# Patient Record
Sex: Female | Born: 1975 | Race: White | Hispanic: No | Marital: Married | Smoking: Never smoker
Health system: Southern US, Community
[De-identification: ages and names within clinical notes are randomized; demographics above are authoritative.]

## PROBLEM LIST (undated history)

## (undated) DIAGNOSIS — F909 Attention-deficit hyperactivity disorder, unspecified type: Secondary | ICD-10-CM

## (undated) DIAGNOSIS — N200 Calculus of kidney: Secondary | ICD-10-CM

## (undated) DIAGNOSIS — I10 Essential (primary) hypertension: Secondary | ICD-10-CM

## (undated) DIAGNOSIS — K529 Noninfective gastroenteritis and colitis, unspecified: Secondary | ICD-10-CM

## (undated) DIAGNOSIS — G43909 Migraine, unspecified, not intractable, without status migrainosus: Secondary | ICD-10-CM

## (undated) DIAGNOSIS — G459 Transient cerebral ischemic attack, unspecified: Secondary | ICD-10-CM

## (undated) DIAGNOSIS — F419 Anxiety disorder, unspecified: Secondary | ICD-10-CM

## (undated) DIAGNOSIS — E039 Hypothyroidism, unspecified: Secondary | ICD-10-CM

## (undated) HISTORY — DX: Noninfective gastroenteritis and colitis, unspecified: K52.9

## (undated) HISTORY — DX: Migraine, unspecified, not intractable, without status migrainosus: G43.909

## (undated) HISTORY — PX: DILATION AND CURETTAGE OF UTERUS: SHX78

## (undated) HISTORY — DX: Calculus of kidney: N20.0

## (undated) HISTORY — PX: OTHER SURGICAL HISTORY: SHX169

## (undated) HISTORY — PX: THYROIDECTOMY: SHX17

## (undated) HISTORY — DX: Anxiety disorder, unspecified: F41.9

## (undated) HISTORY — DX: Attention-deficit hyperactivity disorder, unspecified type: F90.9

## (undated) HISTORY — DX: Essential (primary) hypertension: I10

## (undated) HISTORY — DX: Transient cerebral ischemic attack, unspecified: G45.9

## (undated) HISTORY — DX: Hypothyroidism, unspecified: E03.9

---

## 2000-08-28 ENCOUNTER — Other Ambulatory Visit: Admission: RE | Admit: 2000-08-28 | Discharge: 2000-08-28 | Payer: Self-pay | Admitting: Obstetrics & Gynecology

## 2000-08-29 ENCOUNTER — Ambulatory Visit (HOSPITAL_COMMUNITY): Admission: RE | Admit: 2000-08-29 | Discharge: 2000-08-29 | Payer: Self-pay | Admitting: Obstetrics & Gynecology

## 2000-08-29 ENCOUNTER — Encounter (INDEPENDENT_AMBULATORY_CARE_PROVIDER_SITE_OTHER): Payer: Self-pay | Admitting: Specialist

## 2006-08-05 ENCOUNTER — Ambulatory Visit (HOSPITAL_COMMUNITY): Admission: RE | Admit: 2006-08-05 | Discharge: 2006-08-05 | Payer: Self-pay | Admitting: Obstetrics and Gynecology

## 2008-03-21 ENCOUNTER — Encounter: Admission: RE | Admit: 2008-03-21 | Discharge: 2008-03-21 | Payer: Self-pay | Admitting: Family Medicine

## 2010-09-30 HISTORY — PX: ABDOMINAL HYSTERECTOMY: SHX81

## 2010-09-30 HISTORY — PX: KNEE SURGERY: SHX244

## 2011-02-15 NOTE — Op Note (Signed)
[facility]  Patient:    Patricia Shannon, Patricia Shannon                        MRN: [id number] Proc. Date: 08/29/00 Attending:  Freddy Finner, M.D.                           Operative Report  PREOPERATIVE DIAGNOSIS:       Missed abortion.  POSTOPERATIVE DIAGNOSIS:      Missed abortion.  OPERATION:                    Dilatation and evacuation.  SURGEON:                      Freddy Finner, M.D.  ANESTHESIA:                   Intravenous sedation and paracervical block.  ESTIMATED INTRAOPERATIVE BLOOD LOSS:                   20 cc.  INTRAOPERATIVE COMPLICATIONS: None.  INDICATIONS:                  The patient is a 35 year old, gravida 2, para 1, who presented to the office for initial OB evaluation on August 28, 2000, at which time she was 14 weeks by dates, but small for dates by exam.  A pelvic ultrasound was obtained, which showed an approximately 7-5/7 weeks size sac within the uterus, but no viable pregnancy.  She is admitted now for D&E.  Her blood type is known to be Rh- and she will be given RhoGAM in recovery.  DESCRIPTION OF PROCEDURE:     She was admitted on the morning of surgery, brought to the operating room, placed under adequate intravenous sedation, and placed in the dorsolithotomy position using the [place] stirrup system. Betadine prep was carried out in the usual fashion.  The cervix was visualized with a bivalve speculum and grasped on the anterior lip with a single-tooth tenaculum.  A paracervical block was placed using 10 cc of 1% plain Xylocaine. Injections were made at 4 and 8 oclock in the vaginal fornices.  The cervix was dilated to 27 with Pratts.  An 8 mm curved suction cannula was introduced and aspiration produced obvious products of conception.  This was continued until it was felt that the cavity was evacuated.  This was confirmed by gentle curettage and exploration with Randall stone forceps and repeated vacuum aspiration.   The procedure was terminated.  The patient was taken to the recovery room in good condition.  She will be discharged with routine outpatient surgical instructions for follow-up in the office in approximately one week. DD:  08/29/00 TD:  08/29/00 Job: [id number]

## 2011-02-15 NOTE — Op Note (Signed)
NAME:  Patricia Shannon, Patricia Shannon NO.:  [id number]   MEDICAL RECORD NO.:  [id number]          PATIENT TYPE:  AMB   LOCATION:  SDC                           FACILITY:  [facility]   PHYSICIAN:  Zenaida Niece, M.D.DATE OF BIRTH:  1975-12-16   DATE OF PROCEDURE:  08/05/2006  DATE OF DISCHARGE:                                 OPERATIVE REPORT   PREOPERATIVE DIAGNOSES:  1. Pelvic pain.  2. Dyspareunia.   POSTOPERATIVE DIAGNOSES:  1. Pelvic pain.  2. Dyspareunia.  3. Possible endometriosis.  4. Abdominal adhesions.   PROCEDURES:  Laparoscopy with fulguration of lesion on the left uterosacral  ligament and adhesiolysis.   SURGEON:  Zenaida Niece, M.D.   ANESTHESIA:  General endotracheal tube.   SPECIMENS:  None.   ESTIMATED BLOOD LOSS:  Minimal.   COMPLICATIONS:  None.   FINDINGS:  She had a normal upper abdomen.  Appendix was normal, although it  did appear to be slightly retroperitoneal above the right pelvic brim.  Uterus, tubes and ovaries appeared normal.  There were some reddish lesions  on the area of the left uterosacral ligament where it entered the uterus.  There were adhesions of the bowel to the right abdominal sidewall.   PROCEDURE IN DETAIL:  The patient was taken to the operating room and placed  in the dorsal supine position.  General anesthesia was induced and she was  placed in mobile stirrups.  Abdomen was prepped and draped in the usual  sterile fashion, bladder drained with a red rubber catheter, Hulka tenaculum  applied to the cervix for uterine manipulation.  Infraumbilical skin was  infiltrated with 0.25% Marcaine and a 0.75 cm horizontal incision was made.  The Veress needle was inserted in the peritoneal cavity and placement  confirmed by the water drop test and an opening pressure of 2 mmHg.  CO2 gas  was insufflated to a pressure of 12 mmHg and the Veress needle was removed.  A 5-mm XL trocar was introduced with direct visualization.  A  5-mm port was  then placed on the left side also under direct visualization.  Bowels were  moved up out of the pelvis and inspection revealed the above-mentioned  findings.  The only source for pelvic pain I could see were these possible  reddish lesions at the insertion of the left uterosacral ligament into the  uterus.  These were fulgurated with bipolar cautery, careful to avoid the  ureter, which was well-visualized lateral.  The adhesions of the bowel to  the right abdominal wall were taken down sharply and bleeding controlled  with bipolar cautery.  No further significant lesions were noted.  The 5 mm  port on the left side was removed with direct visualization and all gas  allowed to deflate from the abdomen.  The umbilical trocar was then removed.  Skin incisions were closed with interrupted subcuticular sutures of 4-0  Vicryl followed by Dermabond.  The Hulka tenaculum was then removed.  The  patient tolerated the procedure well.  She was extubated in the operating  room and taken to the recovery  room in stable condition.  Counts were  correct, and she had PAS hose on throughout the procedure.      Zenaida Niece, M.D.  Electronically Signed     TDM/MEDQ  D:  08/05/2006  T:  08/05/2006  Job:  [id number]

## 2011-02-15 NOTE — H&P (Signed)
NAME:  Patricia Shannon, Patricia Shannon NO.:  [id number]   MEDICAL RECORD NO.:  [id number]          PATIENT TYPE:  AMB   LOCATION:  SDC                           FACILITY:  [facility]   PHYSICIAN:  Zenaida Niece, M.D.DATE OF BIRTH:  09-22-1976   DATE OF ADMISSION:  08/05/2006  DATE OF DISCHARGE:                                HISTORY & PHYSICAL   CHIEF COMPLAINT:  Pelvic pain and dyspareunia.   HISTORY OF PRESENT ILLNESS:  This is a 35 year old white female, gravida 4,  para 3-0-1-3, whom I saw on September 11.  She complains of deep dyspareunia  on the left side, which is every time she has intercourse and is gradually  getting worse.  She also has left pelvic pain for 2-3 weeks prior to her  period, and the pain actually gets better with her period.  This pain is not  associated with urination or bowel movement.  She does have regular periods  every month and occasional post-coital bleeding that can require a pad.  She  has had a hysteroscopy to evaluate for abnormal bleeding, and this is  normal.  All options were discussed with the patient, and she wishes to  proceed with a laparoscopic evaluation for her pelvic pain and dyspareunia.   PAST OB HISTORY:  Three vaginal deliveries at term, and one spontaneous  abortion.   PAST MEDICAL HISTORY:  TIA x2 and a history of migraine headaches.   PAST SURGICAL HISTORY:  Appendectomy, D&C and hysteroscopy.   ALLERGIES:  CODEINE (causes nausea and vomiting).  TOPAMAX, ASPIRIN and  VALTREX.   GYN HISTORY:  History of herpes simplex virus, and no history of abnormal  Pap smears.   FAMILY HISTORY:  Aunt with breast cancer.   REVIEW OF SYSTEMS:  Normal bowel and bladder function.   SOCIAL HISTORY:  She is married and denies alcohol, tobacco or drug use.   PHYSICAL EXAMINATION:  GENERAL:  This is a well developed female in no acute  distress.  NECK:  Supple without lymphadenopathy or thyromegaly.  LUNGS:  Clear to auscultation.  HEART:  Regular rate and rhythm without murmur.  ABDOMEN:  Soft and nontender, nondistended; without palpable masses.  EXTREMITIES:  No edema and are nontender.  PELVIC:  External genitalia has no lesions.  On speculum examination her  cervix is normal.  On bimanual examination she has a small anteverted  nontender uterus, with slightly tender left ureterosacral ligament and no  adnexal mass.   ASSESSMENT:  Pelvic pain and dyspareunia.  She does have a tender left  uterosacral ligament, which is suspicious for endometriosis.  All  nonsurgical and surgical options have been discussed with the patient, and  she wishes to proceed with laparoscopic evaluation for possible  endometriosis and fulguration of any lesions or adhesiolysis.  All risks of  surgery have been discussed and she understands.   PLAN:  Admit the patient on the day of surgery for diagnostic and possible  operative laparoscopy, with fulguration of endometriosis if present.      Zenaida Niece, M.D.  Electronically Signed  TDM/MEDQ  D:  08/04/2006  T:  08/04/2006  Job:  [id number]

## 2012-09-30 HISTORY — PX: OTHER SURGICAL HISTORY: SHX169

## 2015-07-14 ENCOUNTER — Other Ambulatory Visit: Payer: Self-pay | Admitting: Allergy and Immunology

## 2016-09-12 HISTORY — PX: COLONOSCOPY: SHX174

## 2018-10-14 ENCOUNTER — Encounter: Payer: Self-pay | Admitting: Gastroenterology

## 2018-10-19 ENCOUNTER — Ambulatory Visit: Payer: Self-pay | Admitting: Gastroenterology

## 2018-10-19 ENCOUNTER — Ambulatory Visit: Payer: BLUE CROSS/BLUE SHIELD | Admitting: Gastroenterology

## 2018-10-19 VITALS — BP 144/96 | HR 60 | Ht 69.0 in | Wt 176.0 lb

## 2018-10-19 DIAGNOSIS — R1032 Left lower quadrant pain: Secondary | ICD-10-CM

## 2018-10-19 DIAGNOSIS — K589 Irritable bowel syndrome without diarrhea: Secondary | ICD-10-CM

## 2018-10-19 MED ORDER — SUPREP BOWEL PREP KIT 17.5-3.13-1.6 GM/177ML PO SOLN: 1.0000 | ORAL | 0 refills | Status: DC

## 2018-10-19 MED ORDER — HYOSCYAMINE SULFATE 0.125 MG SL SUBL
0.1250 mg | SUBLINGUAL_TABLET | Freq: Four times a day (QID) | SUBLINGUAL | 3 refills | Status: DC | PRN
Start: 2018-10-19 — End: 2020-03-14

## 2018-10-19 NOTE — Progress Notes (Signed)
Chief Complaint: FU  Referring Provider:  Nonnie Done., MD      ASSESSMENT AND PLAN;   #1. LLQ pain. Neg CT 09/10/2018 (report sent for scanning), CT 04/2016-showed questionable colitis.  Patient with H/O endometriosis in the past s/p partial hysterectomy 2012. Had colonoscopy 08/2016-poor preparation.  Negative random colonic biopsies.   #2.  IBS with alternating constipation and diarrhea. Neg stool studies 08/2018.  CBC revealed slightly elevated WBC count 11.7 with normal hemoglobin and platelet count.  Treated with Flagyl 09/01/2018.  History of occasional rectal bleeding is concerning.  Plan: -Continue current water intake, current level of physical activity including running for now. -Proceed with colonoscopy with 2-day preparation.  Explained risks and benefits. -Benefiber 1 tablespoon p.o. every morning with 8 ounces of water.  If she has diarrhea she can hold off. -Levsin 0.125 mg S/L Q4-6 hrs prn for lower abdominal pain. -If still with problems and she has predominant constipation, will give her a trial of Linzess. -If she continues to have problems despite of aggressive medical treatment and the entire work-up above is negative, would consider diagnostic laparoscopy as previously she had significant adhesions, likely due to endometriosis.  Certainly, that will be the last resort. HPI:    Patricia Shannon is a 43 y.o. female  With left lower quad abdominal pain since December-crampy, was associated with fever and subjective chills.  She was initially seen in the urgent care center-stool studies were negative.  However, she had elevated WBC count and was treated with Flagyl, Lomotil and Zofran as needed. Unfortunately, she continues to have abdominal pain.  She was sent to the emergency room where she underwent CT scan of the abdomen pelvis was unremarkable. Thereafter referred over to GI clinic for possible further evaluation. She has occasional rare rectal  bleeding.  Most of the time she is constipated with passage of pellet-like stools with significant mucus.  However she starts having intermittent diarrhea as well.  This mostly occurs after drinking plenty of water.  She is a avid runner.  Denies having any significant weight loss.  She has been advised to get colonoscopy performed as previous colonoscopy was limited.  She does admit that the Xanax does help her to sleep which does relieve abdominal pain.    Past Medical History:  Diagnosis Date  . Anxiety   . Colitis   . Hypertension   . Hypothyroidism   . Kidney stones   . Migraines     Past Surgical History:  Procedure Laterality Date  . ABDOMINAL HYSTERECTOMY  2012   partial, due to endometriosis  . COLONOSCOPY  09/12/2016   Samll internal hemorrhoids. Otherwise normal colonnoscopy. Highly redundant colon  . KNEE SURGERY Left 2012  . THYROIDECTOMY     subtotal    Family History  Problem Relation Age of Onset  . Esophagitis Maternal Uncle        is an alcoholic  . Colon cancer Neg Hx     Social History   Tobacco Use  . Smoking status: Never Smoker  . Smokeless tobacco: Never Used  Substance Use Topics  . Alcohol use: Not Currently  . Drug use: Not Currently    Current Outpatient Medications  Medication Sig Dispense Refill  . ALPRAZolam (XANAX) 0.5 MG tablet 1 tablet daily. Usually at night    . aspirin EC 81 MG tablet daily.    Marland Kitchen eletriptan (RELPAX) 40 MG tablet as needed.    . loratadine (CLARITIN) 10 MG  tablet Take 10 mg by mouth daily.    . valACYclovir (VALTREX) 500 MG tablet Take 500 mg by mouth daily.     No current facility-administered medications for this visit.     Allergies  Allergen Reactions  . Amoxicillin Other (See Comments)     Upset stomach   . Codeine Nausea Only    unknown Pt said she can take with pheregan     Review of Systems:  Constitutional: Denies fever, chills, diaphoresis, appetite change and fatigue.  HEENT:  Denies photophobia, eye pain, redness, hearing loss, ear pain, congestion, sore throat, rhinorrhea, sneezing, mouth sores, neck pain, neck stiffness and tinnitus.   Respiratory: Denies SOB, DOE, cough, chest tightness,  and wheezing.   Cardiovascular: Denies chest pain, palpitations and leg swelling.  Genitourinary: Denies dysuria, urgency, frequency, hematuria, flank pain and difficulty urinating.  Musculoskeletal: Denies myalgias, back pain, joint swelling, arthralgias and gait problem.  Skin: No rash.  Neurological: Denies dizziness, seizures, syncope, weakness, light-headedness, numbness and has migraine headaches.  Hematological: Denies adenopathy. Easy bruising, personal or family bleeding history  Psychiatric/Behavioral: Has anxiety or depression     Physical Exam:    BP (!) 144/96   Pulse 60   Ht 5\' 9"  (1.753 m)   Wt 176 lb (79.8 kg)   BMI 25.99 kg/m  Filed Weights   10/19/18 1137  Weight: 176 lb (79.8 kg)   Constitutional:  Well-developed, in no acute distress. Psychiatric: Normal mood and affect. Behavior is normal. HEENT: Pupils normal.  Conjunctivae are normal. No scleral icterus. Neck supple.  Cardiovascular: Normal rate, regular rhythm. No edema Pulmonary/chest: Effort normal and breath sounds normal. No wheezing, rales or rhonchi. Abdominal: Soft, nondistended.  Mild tenderness in the left lower quadrant.  No rebound. Bowel sounds active throughout. There are no masses palpable. No hepatomegaly. Rectal:  defered Neurological: Alert and oriented to person place and time. Skin: Skin is warm and dry. No rashes noted.    Edman Circle, MD 10/19/2018, 12:05 PM  Cc: Nonnie Done., MD

## 2018-10-19 NOTE — Patient Instructions (Signed)
If you are age 44 or older, your body mass index should be between 23-30. Your Body mass index is 25.99 kg/m. If this is out of the aforementioned range listed, please consider follow up with your Primary Care Provider.  If you are age 80 or younger, your body mass index should be between 19-25. Your Body mass index is 25.99 kg/m. If this is out of the aformentioned range listed, please consider follow up with your Primary Care Provider.   You have been scheduled for a colonoscopy. Please follow written instructions given to you at your visit today.  Please pick up your prep supplies at the pharmacy within the next 1-3 days. If you use inhalers (even only as needed), please bring them with you on the day of your procedure. Your physician has requested that you go to www.startemmi.com and enter the access code given to you at your visit today. This web site gives a general overview about your procedure. However, you should still follow specific instructions given to you by our office regarding your preparation for the procedure.  Please purchase the following medications over the counter and take as directed: Benefiber 1 tablespoon in 8 ounces of water daily.   Two days before your procedure: Mix 3 packs (or capfuls) of Miralax in 48 ounces of clear liquid and drink at 6pm.   We have sent the following medications to your pharmacy for you to pick up at your convenience: Suprep Levsin   Thank you,  Dr. Lynann Bologna

## 2018-10-20 ENCOUNTER — Encounter: Payer: Self-pay | Admitting: Gastroenterology

## 2018-10-22 ENCOUNTER — Encounter: Payer: BLUE CROSS/BLUE SHIELD | Admitting: Gastroenterology

## 2018-10-28 ENCOUNTER — Telehealth: Payer: Self-pay | Admitting: Gastroenterology

## 2018-10-28 NOTE — Telephone Encounter (Signed)
Pt has colon scheduled tomorrow 10/29/18 and wants to know if could take zofran for nausea.

## 2018-10-28 NOTE — Telephone Encounter (Signed)
Called [phone] and spoke with pt.  Pt also asked if okay to take zofran or phenergan?  She has both.  Pt advised either one is fine to take.  She is beginning with a migraine headache.  I also advised her she can take her migraine medication too.  Pt to call us back if any further questions. maw

## 2018-10-29 ENCOUNTER — Ambulatory Visit (AMBULATORY_SURGERY_CENTER): Payer: BLUE CROSS/BLUE SHIELD | Admitting: Gastroenterology

## 2018-10-29 ENCOUNTER — Encounter: Payer: BLUE CROSS/BLUE SHIELD | Admitting: Gastroenterology

## 2018-10-29 ENCOUNTER — Encounter: Payer: Self-pay | Admitting: Gastroenterology

## 2018-10-29 VITALS — BP 136/78 | HR 58 | Temp 99.0°F | Resp 12 | Ht 69.0 in | Wt 176.0 lb

## 2018-10-29 DIAGNOSIS — K5730 Diverticulosis of large intestine without perforation or abscess without bleeding: Secondary | ICD-10-CM

## 2018-10-29 DIAGNOSIS — K649 Unspecified hemorrhoids: Secondary | ICD-10-CM | POA: Diagnosis not present

## 2018-10-29 DIAGNOSIS — R1032 Left lower quadrant pain: Secondary | ICD-10-CM

## 2018-10-29 MED ORDER — SODIUM CHLORIDE 0.9 % IV SOLN: 500.0000 mL | Freq: Once | INTRAVENOUS | Status: DC

## 2018-10-29 NOTE — Progress Notes (Signed)
To PACU, VSS. Report to [email or web address] 

## 2018-10-29 NOTE — Op Note (Addendum)
[facility] Patient Name: Patricia Shannon Procedure Date: 10/29/2018 3:19 PM MRN: [id number] Endoscopist: Lynann Bologna , MD Age: 43 Referring MD:  Date of Birth: [birth year] Gender: Female Account #: [id number] Procedure:                Colonoscopy Indications:              Abdominal pain in the left lower quadrant Medicines:                Monitored Anesthesia Care Procedure:                Pre-Anesthesia Assessment:                           - Prior to the procedure, a History and Physical                            was performed, and patient medications and                            allergies were reviewed. The patient's tolerance of                            previous anesthesia was also reviewed. The risks                            and benefits of the procedure and the sedation                            options and risks were discussed with the patient.                            All questions were answered, and informed consent                            was obtained. Prior Anticoagulants: The patient has                            taken no previous anticoagulant or antiplatelet                            agents. ASA Grade Assessment: I - A normal, healthy                            patient. After reviewing the risks and benefits,                            the patient was deemed in satisfactory condition to                            undergo the procedure.                           After obtaining informed consent, the colonoscope  was passed under direct vision. Throughout the                            procedure, the patient's blood pressure, pulse, and                            oxygen saturations were monitored continuously. The                            Colonoscope was introduced through the anus and                            advanced to the 4 cm into the ileum. The                            colonoscopy was performed without  difficulty. The                            patient tolerated the procedure well. The quality                            of the bowel preparation was excellent. Scope In: 3:28:53 PM Scope Out: 3:46:41 PM Scope Withdrawal Time: 0 hours 11 minutes 54 seconds  Total Procedure Duration: 0 hours 17 minutes 48 seconds  Findings:                 2-3 minimal superficial small-mouthed diverticula                            were found in the sigmoid colon. Very mild                            melanosis coli.                           Biopsies for histology were taken with a cold                            forceps from the descending colon, sigmoid colon                            and rectum for evaluation of microscopic colitis.                            Estimated blood loss: none.                           Non-bleeding internal hemorrhoids were found during                            retroflexion. The hemorrhoids were small.                           The exam was otherwise without abnormality on  direct and retroflexion views.                           The terminal ileum appeared normal. Complications:            No immediate complications. Estimated Blood Loss:     Estimated blood loss: none. Impression:               -Very minimal sigmoid diverticulosis                           -Small internal hemorrhoids.                           -Otherwise normal colonoscopy to TI. Recommendation:           - Patient has a contact number available for                            emergencies. The signs and symptoms of potential                            delayed complications were discussed with the                            patient. Return to normal activities tomorrow.                            Written discharge instructions were provided to the                            patient.                           - Resume previous diet.                           - Continue present  medications.                           - Await pathology results.                           - Repeat colonoscopy in 10 years for screening                            purposes.                           - Colace 1 tablet p.o. twice daily. If still with                            constipation, add MiraLAX 17 g p.o. once a day. If                            she has diarrhea she can back off of MiraLAX.Marland Kitchen                           -  Levsin 0.125 mg S/L Q4-6 hrs prn for lower                            abdominal pain.                           - If still with problems and she has predominant                            constipation, will give her a trial of Linzess.                           - If she still with problems, GYN consultation for                            possible diagnostic laparoscopy as previously she                            had significant adhesions in the past, likely due                            to endometriosis.                           - Return to GI clinic in 12 weeks. Lynann Bolognaajesh Solana Coggin, MD 10/29/2018 3:54:50 PM This report has been signed electronically.

## 2018-10-29 NOTE — Progress Notes (Signed)
Called to room to assist during endoscopic procedure.  Patient ID and intended procedure confirmed with present staff. Received instructions for my participation in the procedure from the performing physician.  

## 2018-10-29 NOTE — Patient Instructions (Addendum)
Continue present medications. Colace 1 tablet twice a day, if that does not work try Miralax 17 g once a day. Levsin 0.125 mg under the tongue every 4-6 hrs for lower abdominal pain. Await pathology results. Return to GI clinic in 12 weeks.    YOU HAD AN ENDOSCOPIC PROCEDURE TODAY AT THE [facility]:   Refer to the procedure report that was given to you for any specific questions about what was found during the examination.  If the procedure report does not answer your questions, please call your gastroenterologist to clarify.  If you requested that your care partner not be given the details of your procedure findings, then the procedure report has been included in a sealed envelope for you to review at your convenience later.  YOU SHOULD EXPECT: Some feelings of bloating in the abdomen. Passage of more gas than usual.  Walking can help get rid of the air that was put into your GI tract during the procedure and reduce the bloating. If you had a lower endoscopy (such as a colonoscopy or flexible sigmoidoscopy) you may notice spotting of blood in your stool or on the toilet paper. If you underwent a bowel prep for your procedure, you may not have a normal bowel movement for a few days.  Please Note:  You might notice some irritation and congestion in your nose or some drainage.  This is from the oxygen used during your procedure.  There is no need for concern and it should clear up in a day or so.  SYMPTOMS TO REPORT IMMEDIATELY:   Following lower endoscopy (colonoscopy or flexible sigmoidoscopy):  Excessive amounts of blood in the stool  Significant tenderness or worsening of abdominal pains  Swelling of the abdomen that is new, acute  Fever of 100F or higher    For urgent or emergent issues, a gastroenterologist can be reached at any hour by calling [phone].   DIET:  We do recommend a small meal at first, but then you may proceed to your regular diet.  Drink plenty of  fluids but you should avoid alcoholic beverages for 24 hours.  ACTIVITY:  You should plan to take it easy for the rest of today and you should NOT DRIVE or use heavy machinery until tomorrow (because of the sedation medicines used during the test).    FOLLOW UP: Our staff will call the number listed on your records the next business day following your procedure to check on you and address any questions or concerns that you may have regarding the information given to you following your procedure. If we do not reach you, we will leave a message.  However, if you are feeling well and you are not experiencing any problems, there is no need to return our call.  We will assume that you have returned to your regular daily activities without incident.  If any biopsies were taken you will be contacted by phone or by letter within the next 1-3 weeks.  Please call us at [phone] if you have not heard about the biopsies in 3 weeks.    SIGNATURES/CONFIDENTIALITY: You and/or your care partner have signed paperwork which will be entered into your electronic medical record.  These signatures attest to the fact that that the information above on your After Visit Summary has been reviewed and is understood.  Full responsibility of the confidentiality of this discharge information lies with you and/or your care-partner.

## 2018-10-30 ENCOUNTER — Telehealth: Payer: Self-pay

## 2018-10-30 NOTE — Telephone Encounter (Signed)
Patient states last night when arriving home had abdominal gas pain, 8/10. Feeling better this morning, pain 3/10. Patient instructed to call [facility]  today if having any further pain, or if pain does not subside, B.Maley Venezia RN

## 2018-10-30 NOTE — Telephone Encounter (Signed)
  Follow up Call-  Call back number 10/29/2018  Post procedure Call Back phone  # [phone]  Permission to leave phone message Yes  Some recent data might be hidden     Patient questions:  Do you have a fever, pain , or abdominal swelling? No. Pain Score  0 *  Have you tolerated food without any problems? Yes.    Have you been able to return to your normal activities? Yes.    Do you have any questions about your discharge instructions: Diet   No. Medications  No. Follow up visit  No.  Do you have questions or concerns about your Care? No.  Actions: * If pain score is 4 or above: No action needed, pain <4.

## 2018-11-09 ENCOUNTER — Telehealth: Payer: Self-pay | Admitting: Gastroenterology

## 2018-11-09 NOTE — Telephone Encounter (Signed)
[facility] OF THE DAY Chales Abrahams patient-patient reports she has had 1 bowel movement (last night) since her colonoscopy on 10/29/2018, stool had blood "intertwined in the stool"; patient reports she consumes 12-15 bottles of water per day, denies use of sodas, drinks 1 glass of sweet tea per day, has been taking the Colace twice daily as prescribed, has NOT been using the Miralax as prescribed; Patient also reports she has not being straining to use the restroom; Patient reports blood on stool with mucous, however, blood does not fill the toilet;  Per colonoscopy procedure report from 10/29/2018- "If still with problems and she has predominant constipation, will give her a trial of Linzess"  Patient is also requesting the pathology results from 10/29/2018  Please advise

## 2018-11-10 NOTE — Telephone Encounter (Signed)
Called and spoke with patient-patient informed of result note and MD recommendations; patient is agreeable with plan of care; Patient verbalized understanding of information/instructions;  Patient was advised to call the office at [phone] if questions/concerns arise; 

## 2018-11-10 NOTE — Telephone Encounter (Signed)
Biopsy results from recent colonoscopy reviewed and notable for benign colonic mucosa, without evidence of Microscopic Colitis, infectious colitis, active or chronic inflammatory changes.  Aside from those biopsies, no additional intervention performed during colonoscopy (i.e. snare polypectomy).  Colonoscopy was notable for internal hemorrhoids, which is likely source for hematochezia.  Agree with Dr. Urban Gibson previous recommendation to add MiraLAX with goal for soft, regular stools without straining to have BM.  If the addition of MiraLAX not effective, then agree with escalating to a trial of Linzess (or Trulance).  Alternatively, if she has trialed MiraLAX in the past and found to be ineffective, can start trial of Linzess now, 145 mcg daily, #90, refill 1.  Continue adequate fluid hydration as currently described.  Can follow-up in the clinic as previously scheduled.  Thank you.

## 2018-11-18 ENCOUNTER — Encounter: Payer: Self-pay | Admitting: Gastroenterology

## 2018-11-30 ENCOUNTER — Telehealth: Payer: Self-pay | Admitting: Gastroenterology

## 2018-11-30 NOTE — Telephone Encounter (Signed)
Pt states that she still has very bad diarrhea, she stopped taking miralax two weeks ago and diarrhea continues. She has an appt. Tomorrow with Dr. Chales Abrahams but wants to know if she needs to be seen as she was here not long ago or if something can be prescribed.

## 2018-11-30 NOTE — Telephone Encounter (Signed)
Called and spoke with patient-patient reports she has been having diarrhea with the patient eating vs not eating; patient reports she is using Levsin maybe once per day and maybe every other day; patient denies fever, nausea, vomiting, denies eating anything that would cause diarrhea-dietary habits have not changed; has been having diarrhea since 11/17/2018 3-4-5-6 times per day; patient reports she has not taken anything OTC to "stop the diarrhea",   Diagnosed with: IBS with alternating constipation and diarrhea on 10/19/2018;  Patient advised to continue to stay hydrated, eat higher fiber foods for supper to assist with "bulking up stool", and to keep the scheduled appt with Dr. Chales Abrahams on 12/01/2018 at 3:00pm;  patient was advised to call back if questions/concerns arise; Patient verbalized understanding of information/instructions;

## 2018-12-01 ENCOUNTER — Ambulatory Visit: Payer: BLUE CROSS/BLUE SHIELD | Admitting: Gastroenterology

## 2018-12-08 ENCOUNTER — Ambulatory Visit: Payer: BLUE CROSS/BLUE SHIELD | Admitting: Neurology

## 2018-12-08 ENCOUNTER — Encounter

## 2020-03-13 NOTE — Progress Notes (Signed)
[facility]    Provider:  Dr Lucia GaskinsAhern Requesting Provider: Nonnie DoneSlatosky, John J., MD Primary Care Provider:  Nonnie DoneSlatosky, John J., MD  CC:  migraines  HPI:  Patricia Shannon is a 44 y.o. female here as requested by Nonnie DoneSlatosky, John J., MD for headaches. She was recommended to see us by Melina Schoolsobyn at Dr. Derek JackBarber's office and she works at [employer].  Past medical history hyperlipidemia, benign hypertension, migraine without aura and without status migrainosus not intractable, anxiety with depression, TIA, renal stones, ADHD.  I reviewed Dr. Jarvis NewcomerPetoskey's notes, Visual acuity OU 20/20, OD 20/20, OS 20/20, I reviewed physical examination which appeared normal including psychiatric, pulmonary, general, head eyes ears nose throat, neck, chest, heart, abdomen, back, neurologic, musculoskeletal, no alcohol or tobacco use.  I last appointment TSH, CBC with differential, lipid panel and CMP was ordered which appears to be November 10, 2019.  Patient has had headaches for more than 10 years per Dr. Eldridge DaceSlough Totzke, location most often right temporal but variable locations, can last all day, at last appointment had been ongoing for 5 out of 7 days, moderate to severe, throbbing, triggered by caffeine and chocolate, associated with nausea, photophobia, phonophobia, rare visual aura, in the past she was on Topamax and she was taking 100 mg.  At last appointment she was restarted on Topamax 100 mg twice daily with instructions how to titrate slowly.  Neurologic exam also appeared normal from notes.  Patient here alone. She has tried "everything". She saw Dr. Adella HareApplegate in the past but he retired, she also saw Dr[facility] Neale BurlyFreeman [facility]. Usually start in right, temporal area ut other locations too, mod to severe, throbbingpulsating/pounding, photophobia, phonophobia, nausea and vomiting. She usually wakes up with migraine most of the time and sometimes it wakes her up at 4am-5am in the morning it is worse,  relpax used to help, now she has to take 2 and she ends up at urgent care for toradol. Migraines last 24-72 hours and they are severe, it affects her work, she has an active job. She has 15 headache days a month, 8 migraine days a month for more than the last year, no medication overuse, no frequent aura but her vision is impaired always blurry with peripheral visio loss and she can't get her words out. Headaches are positional. She has had a hysterectomy. No other focal neurologic deficits, associated symptoms, inciting events or modifiable factors.  Reviewed notes, labs and imaging from outside physicians, which showed:  From a review of records from requesting provider, epic and "care everywhere", it appears patient's has been on the following medications that can be used in migraine management: Effexor, aspirin, Toprol-XL, Zonegran, Toradol, Relpax, amitriptyline,nortriptyline, Topamax, Toradol injections.  I do not see any brain imaging in the information I have access to.  I will request the results of recent lab work results from requesting provider.  I did see evidence in prior history that he has been seen [facility] as recent as 2018, I reviewed last visit from October 25, 2016 in which patient was administered Toradol injection, there were also notes from several other visits however no details are provided on "care everywhere" for these visits as far back as 2015.  12/2019: BUN 7, creatinine .71  Review of Systems: Patient complains of symptoms per HPI as well as the following symptoms:headache. Pertinent negatives and positives per HPI. All others negative.   Social History   Socioeconomic History  .  Marital status: Married    Spouse name: Not on file  . Number of children: 3  . Years of education: Not on file  . Highest education level: Not on file  Occupational History  . Occupation: Charity fundraiser  Tobacco Use  . Smoking status: Never Smoker  . Smokeless  tobacco: Never Used  Vaping Use  . Vaping Use: Never used  Substance and Sexual Activity  . Alcohol use: Yes    Comment: 1 glass every 3-4 months   . Drug use: Never  . Sexual activity: Not on file  Other Topics Concern  . Not on file  Social History Narrative   Lives at home with husband and kids   Right handed   Caffeine: 1 cup/day   Social Determinants of Health   Financial Resource Strain:   . Difficulty of Paying Living Expenses:   Food Insecurity:   . Worried About Programme researcher, broadcasting/film/video in the Last Year:   . Barista in the Last Year:   Transportation Needs:   . Freight forwarder (Medical):   Marland Kitchen Lack of Transportation (Non-Medical):   Physical Activity:   . Days of Exercise per Week:   . Minutes of Exercise per Session:   Stress:   . Feeling of Stress :   Social Connections:   . Frequency of Communication with Friends and Family:   . Frequency of Social Gatherings with Friends and Family:   . Attends Religious Services:   . Active Member of Clubs or Organizations:   . Attends Banker Meetings:   Marland Kitchen Marital Status:   Intimate Partner Violence:   . Fear of Current or Ex-Partner:   . Emotionally Abused:   Marland Kitchen Physically Abused:   . Sexually Abused:     Family History  Problem Relation Age of Onset  . Esophagitis Maternal Uncle        is an alcoholic  . Stroke Father        at 39 y.o.  . CAD Father        triple bypass  . Migraines Paternal Grandmother        bad headaches, not diagnosed with migraines but suspect   . Stroke Paternal Grandfather        at 50 y.o.  . Colon cancer Neg Hx     Past Medical History:  Diagnosis Date  . ADHD    pt is unaware of this diagnosis. it was listed in referral notes from [facility].  Marland Kitchen Anxiety   . Colitis   . Hypertension   . Hypothyroidism   . Kidney stones    kideny stone  . Migraines   . Renal stones   . TIA (transient ischemic attack)    x23 at 44 years old    Patient Active  Problem List   Diagnosis Date Noted  . Chronic migraine without aura without status migrainosus, not intractable 03/14/2020    Past Surgical History:  Procedure Laterality Date  . ABDOMINAL HYSTERECTOMY  2012   partial, due to endometriosis  . appendectomy    . COLONOSCOPY  09/12/2016   Samll internal hemorrhoids. Otherwise normal colonnoscopy. Highly redundant colon  . DILATION AND CURETTAGE OF UTERUS    . kidney stent  2014  . KNEE SURGERY Left 2012  . THYROIDECTOMY     subtotal    Current Outpatient Medications  Medication Sig Dispense Refill  . ALPRAZolam (XANAX) 0.5 MG tablet 1 tablet daily. Usually at night    .  aspirin EC 81 MG tablet Take 81 mg by mouth daily.     Marland Kitchen eletriptan (RELPAX) 40 MG tablet as needed.    Marland Kitchen ketorolac (TORADOL) 10 MG tablet Take 10 mg by mouth every 6 (six) hours as needed.    . loratadine (CLARITIN) 10 MG tablet Take 10 mg by mouth daily.    . ondansetron (ZOFRAN) 8 MG tablet Take 8 mg by mouth 3 (three) times daily as needed.    . valACYclovir (VALTREX) 500 MG tablet Take 500 mg by mouth daily.    . Fremanezumab-vfrm (AJOVY) 225 MG/1.5ML SOAJ Inject 225 mg into the skin every 30 (thirty) days. 3 pen 4  . ketorolac (TORADOL) 60 MG/2ML SOLN injection Inject 60 mg (2 mL) into the muscle every 6 hours as needed for headache. Max 2 doses (120 mg) in 24 hours. Do not use more than 4 days per month. 16 mL 6  . methylPREDNISolone (MEDROL DOSEPAK) 4 MG TBPK tablet Take pills in the morning with food x 6 days 21 tablet 1  . Ubrogepant (UBRELVY) 100 MG TABS Take 100 mg by mouth every 2 (two) hours as needed. Maximum 200mg  a day. 10 tablet 11   No current facility-administered medications for this visit.    Allergies as of 03/14/2020 - Review Complete 03/14/2020  Allergen Reaction Noted  . Amoxicillin Other (See Comments) 11/11/2016  . Codeine Nausea Only 07/13/2014    Vitals: BP (!) 165/91 (BP Location: Left Arm, Patient Position: Sitting) Comment: pt  nervous  Pulse (!) 55   Ht 5\' 9"  (1.753 m)   Wt 166 lb (75.3 kg)   BMI 24.51 kg/m  Last Weight:  Wt Readings from Last 1 Encounters:  03/14/20 166 lb (75.3 kg)   Last Height:   Ht Readings from Last 1 Encounters:  03/14/20 5\' 9"  (1.753 m)     Physical exam: Exam: Gen: NAD, conversant, well nourised,  well groomed                     CV: RRR, no MRG. No Carotid Bruits. No peripheral edema, warm, nontender Eyes: Conjunctivae clear without exudates or hemorrhage  Neuro: Detailed Neurologic Exam  Speech:    Speech is normal; fluent and spontaneous with normal comprehension.  Cognition:    The patient is oriented to person, place, and time;     recent and remote memory intact;     language fluent;     normal attention, concentration,     fund of knowledge Cranial Nerves:    The pupils are equal, round, and reactive to light. The fundi are normal flat. Visual fields are full to finger confrontation. Extraocular movements are intact. Trigeminal sensation is intact and the muscles of mastication are normal. The face is symmetric. The palate elevates in the midline. Hearing intact. Voice is normal. Shoulder shrug is normal. The tongue has normal motion without fasciculations.   Coordination:    Normal finger to nose and heel to shin. Normal rapid alternating movements.   Gait:    Heel-toe and tandem gait normal  Motor Observation:    No asymmetry, no atrophy, and no involuntary movements noted. Tone:    Normal muscle tone.    Posture:    Posture is normal. normal erect    Strength:    Strength is V/V in the upper and lower limbs.      Sensation: intact to LT     Reflex Exam:  DTR's:    Deep  tendon reflexes in the upper and lower extremities are normal bilaterally.   Toes:    The toes are downgoing bilaterally.   Clonus:    Clonus is absent.    Assessment/Plan:  Patient with chronic migraines however given concerning symptoms she needs a thorough  evaluation  Bloodwork: cbc and tsh  MRI brain: MRI brain due to concerning symptoms of morning headaches, positional headaches,vision changes  to look for space occupying mass, chiari or intracranial hypertension (pseudotumor).  Start Ajovy preventative Start Ubrelvy acute toradol acute   Orders Placed This Encounter  Procedures  . MR BRAIN W WO CONTRAST  . CBC  . TSH   Meds ordered this encounter  Medications  . Fremanezumab-vfrm (AJOVY) 225 MG/1.5ML SOAJ    Sig: Inject 225 mg into the skin every 30 (thirty) days.    Dispense:  3 pen    Refill:  4    Patient has copay card; she can have medication for $5 regardless of insurance approval or copay amount.  Marland Kitchen Ubrogepant (UBRELVY) 100 MG TABS    Sig: Take 100 mg by mouth every 2 (two) hours as needed. Maximum 200mg  a day.    Dispense:  10 tablet    Refill:  11    Patient has copay card; she can have medication regardless of insurance approval or copay amount.  ketorolac (TORADOL) 60 MG/2ML SOLN injection    Sig: Inject 60 mg (2 mL) into the muscle every 6 hours as needed for headache. Max 2 doses (120 mg) in 24 hours. Do not use more than 4 days per month.    Dispense:  16 mL    Refill:  6    Please dispense 23 gauge 1 in needles x 8 and 3 mL syringes x 8 and alcohol prep pads if able to provide.  . methylPREDNISolone (MEDROL DOSEPAK) 4 MG TBPK tablet    Sig: Take pills in the morning with food x 6 days    Dispense:  21 tablet    Refill:  1   Discussed: To prevent or relieve headaches, try the following: Cool Compress. Lie down and place a cool compress on your head.  Avoid headache triggers. If certain foods or odors seem to have triggered your migraines in the past, avoid them. A headache diary might help you identify triggers.  Include physical activity in your daily routine. Try a daily walk or other moderate aerobic exercise.  Manage stress. Find healthy ways to cope with the stressors, such as delegating tasks on your  to-do list.  Practice relaxation techniques. Try deep breathing, yoga, massage and visualization.  Eat regularly. Eating regularly scheduled meals and maintaining a healthy diet might help prevent headaches. Also, drink plenty of fluids.  Follow a regular sleep schedule. Sleep deprivation might contribute to headaches Consider biofeedback. With this mind-body technique, you learn to control certain bodily functions -- such as muscle tension, heart rate and blood pressure -- to prevent headaches or reduce headache pain.    Proceed to emergency room if you experience new or worsening symptoms or symptoms do not resolve, if you have new neurologic symptoms or if headache is severe, or for any concerning symptom.   Provided education and documentation from American headache Society toolbox including articles on: chronic migraine medication overuse headache, chronic migraines, prevention of migraines, behavioral and other nonpharmacologic treatments for headache.  Discussed:  "There is increased risk for stroke in women with migraine with aura and a contraindication for the combined  contraceptive pill for use by women who have migraine with aura. The risk for women with migraine without aura is lower. However other risk factors like smoking are far more likely to increase stroke risk than migraine. There is a recommendation for no smoking and for the use of OCPs without estrogen such as progestogen only pills particularly for women with migraine with aura.Marland Kitchen People who have migraine headaches with auras may be 3 times more likely to have a stroke caused by a blood clot, compared to migraine patients who don't see auras. Women who take hormone-replacement therapy may be 30 percent more likely to suffer a clot-based stroke than women not taking medication containing estrogen. Other risk factors like smoking and high blood pressure may be  much more important."   Cc: Slatosky, Marshall Cork., MD,    Sarina Ill,  MD  [facility] [address] [place], [place] [address]  Phone [phone] Fax [phone]

## 2020-03-14 ENCOUNTER — Encounter: Payer: Self-pay | Admitting: Neurology

## 2020-03-14 ENCOUNTER — Ambulatory Visit: Payer: BC Managed Care – PPO | Admitting: Neurology

## 2020-03-14 ENCOUNTER — Other Ambulatory Visit: Payer: Self-pay

## 2020-03-14 VITALS — BP 165/91 | HR 55 | Ht 69.0 in | Wt 166.0 lb

## 2020-03-14 DIAGNOSIS — R51 Headache with orthostatic component, not elsewhere classified: Secondary | ICD-10-CM

## 2020-03-14 DIAGNOSIS — H538 Other visual disturbances: Secondary | ICD-10-CM

## 2020-03-14 DIAGNOSIS — H53453 Other localized visual field defect, bilateral: Secondary | ICD-10-CM

## 2020-03-14 DIAGNOSIS — G43709 Chronic migraine without aura, not intractable, without status migrainosus: Secondary | ICD-10-CM | POA: Diagnosis not present

## 2020-03-14 DIAGNOSIS — R519 Headache, unspecified: Secondary | ICD-10-CM

## 2020-03-14 MED ORDER — KETOROLAC TROMETHAMINE 60 MG/2ML IM SOLN: INTRAMUSCULAR | 6 refills | Status: DC

## 2020-03-14 MED ORDER — UBRELVY 100 MG PO TABS: 100.0000 mg | ORAL_TABLET | ORAL | 11 refills | Status: DC | PRN

## 2020-03-14 MED ORDER — AJOVY 225 MG/1.5ML ~~LOC~~ SOAJ: 225.0000 mg | SUBCUTANEOUS | 4 refills | Status: DC

## 2020-03-14 MED ORDER — METHYLPREDNISOLONE 4 MG PO TBPK: ORAL_TABLET | ORAL | 1 refills | Status: DC

## 2020-03-14 NOTE — Patient Instructions (Addendum)
Bloodwork MRI brain Start Ajovy Start Ubrelvy toradol Medrol dosepak  Methylprednisolone tablets What is this medicine? METHYLPREDNISOLONE (meth ill pred NISS oh lone) is a corticosteroid. It is commonly used to treat inflammation of the skin, joints, lungs, and other organs. Common conditions treated include asthma, allergies, and arthritis. It is also used for other conditions, such as blood disorders and diseases of the adrenal glands. This medicine may be used for other purposes; ask your health care provider or pharmacist if you have questions. COMMON BRAND NAME(S): Medrol, Medrol Dosepak What should I tell my health care provider before I take this medicine? They need to know if you have any of these conditions:  Cushing's syndrome  eye disease, vision problems  diabetes  glaucoma  heart disease  high blood pressure  infection (especially a virus infection such as chickenpox, cold sores, or herpes)  liver disease  mental illness  myasthenia gravis  osteoporosis  recently received or scheduled to receive a vaccine  seizures  stomach or intestine problems  thyroid disease  an unusual or allergic reaction to lactose, methylprednisolone, other medicines, foods, dyes, or preservatives  pregnant or trying to get pregnant  breast-feeding How should I use this medicine? Take this medicine by mouth with a glass of water. Follow the directions on the prescription label. Take this medicine with food. If you are taking this medicine once a day, take it in the morning. Do not take it more often than directed. Do not suddenly stop taking your medicine because you may develop a severe reaction. Your doctor will tell you how much medicine to take. If your doctor wants you to stop the medicine, the dose may be slowly lowered over time to avoid any side effects. Talk to your pediatrician regarding the use of this medicine in children. Special care may be needed. Overdosage:  If you think you have taken too much of this medicine contact a poison control center or emergency room at once. NOTE: This medicine is only for you. Do not share this medicine with others. What if I miss a dose? If you miss a dose, take it as soon as you can. If it is almost time for your next dose, talk to your doctor or health care professional. You may need to miss a dose or take an extra dose. Do not take double or extra doses without advice. What may interact with this medicine? Do not take this medicine with any of the following medications:  alefacept  echinacea  live virus vaccines  metyrapone  mifepristone This medicine may also interact with the following medications:  amphotericin B  aspirin and aspirin-like medicines  certain antibiotics like erythromycin, clarithromycin, troleandomycin  certain medicines for diabetes  certain medicines for fungal infections like ketoconazole  certain medicines for seizures like carbamazepine, phenobarbital, phenytoin  certain medicines that treat or prevent blood clots like warfarin  cholestyramine  cyclosporine  digoxin  diuretics  female hormones, like estrogens and birth control pills  isoniazid  NSAIDs, medicines for pain inflammation, like ibuprofen or naproxen  other medicines for myasthenia gravis  rifampin  vaccines This list may not describe all possible interactions. Give your health care provider a list of all the medicines, herbs, non-prescription drugs, or dietary supplements you use. Also tell them if you smoke, drink alcohol, or use illegal drugs. Some items may interact with your medicine. What should I watch for while using this medicine? Tell your doctor or healthcare professional if your symptoms do not start  to get better or if they get worse. Do not stop taking except on your doctor's advice. You may develop a severe reaction. Your doctor will tell you how much medicine to take. This medicine  may increase your risk of getting an infection. Tell your doctor or health care professional if you are around anyone with measles or chickenpox, or if you develop sores or blisters that do not heal properly. This medicine may increase blood sugar levels. Ask your healthcare provider if changes in diet or medicines are needed if you have diabetes. Tell your doctor or health care professional right away if you have any change in your eyesight. Using this medicine for a long time may increase your risk of low bone mass. Talk to your doctor about bone health. What side effects may I notice from receiving this medicine? Side effects that you should report to your doctor or health care professional as soon as possible:  allergic reactions like skin rash, itching or hives, swelling of the face, lips, or tongue  bloody or tarry stools  hallucination, loss of contact with reality  muscle cramps  muscle pain  palpitations  signs and symptoms of high blood sugar such as being more thirsty or hungry or having to urinate more than normal. You may also feel very tired or have blurry vision.  signs and symptoms of infection like fever or chills; cough; sore throat; pain or trouble passing urine Side effects that usually do not require medical attention (report to your doctor or health care professional if they continue or are bothersome):  changes in emotions or mood  constipation  diarrhea  excessive hair growth on the face or body  headache  nausea, vomiting  trouble sleeping  weight gain This list may not describe all possible side effects. Call your doctor for medical advice about side effects. You may report side effects to FDA at 1-800-FDA-1088. Where should I keep my medicine? Keep out of the reach of children. Store at room temperature between 20 and 25 degrees C (68 and 77 degrees F). Throw away any unused medicine after the expiration date. NOTE: This sheet is a summary. It may  not cover all possible information. If you have questions about this medicine, talk to your doctor, pharmacist, or health care provider.  2020 Elsevier/Gold Standard (2018-06-18 09:19:36)   Ketorolac injection What is this medicine? KETOROLAC (kee toe ROLE ak) is a non-steroidal anti-inflammatory drug (NSAID). It is used to treat moderate to severe pain for up to 5 days. It is commonly used after surgery. This medicine should not be used for more than 5 days. This medicine may be used for other purposes; ask your health care provider or pharmacist if you have questions. COMMON BRAND NAME(S): Toradol What should I tell my health care provider before I take this medicine? They need to know if you have any of these conditions:  asthma, especially aspirin-sensitive asthma  bleeding problems  coronary artery bypass graft (CABG) surgery within the past 2 weeks  kidney disease  stomach bleed, ulcer, or other problem  taking aspirin, other NSAID, or probenecid  an unusual or allergic reaction to ketorolac, tromethamine, aspirin, other NSAIDs, other medicines, foods, dyes or preservatives  pregnant or trying to get pregnant  breast-feeding How should I use this medicine? This medicine is for injection into a muscle or into a vein. It is given by a health care professional in a hospital or clinic setting. Talk to your pediatrician regarding the use  of this medicine in children. Special care may be needed. Patients over 45 years old may have a stronger reaction and need a smaller dose. Overdosage: If you think you have taken too much of this medicine contact a poison control center or emergency room at once. NOTE: This medicine is only for you. Do not share this medicine with others. What if I miss a dose? This does not apply. What may interact with this medicine? Do not take this medicine with any of the following medications:  aspirin and aspirin-like  medicines  cidofovir  methotrexate  NSAIDs, medicines for pain and inflammation, like ibuprofen or naproxen  pentoxifylline  probenecid This medicine may also interact with the following medications:  alcohol  alendronate  alprazolam  carbamazepine  diuretics  flavocoxid  fluoxetine  ginkgo  lithium  medicines for blood pressure like enalapril  medicines that affect platelets like pentoxifylline  medicines that treat or prevent blood clots like heparin, warfarin  muscle relaxants  pemetrexed  phenytoin  thiothixene This list may not describe all possible interactions. Give your health care provider a list of all the medicines, herbs, non-prescription drugs, or dietary supplements you use. Also tell them if you smoke, drink alcohol, or use illegal drugs. Some items may interact with your medicine. What should I watch for while using this medicine? Tell your doctor or healthcare provider if your symptoms do not start to get better or if they get worse. This medicine may cause serious skin reactions. They can happen weeks to months after starting the medicine. Contact your healthcare provider right away if you notice fevers or flu-like symptoms with a rash. The rash may be red or purple and then turn into blisters or peeling of the skin. Or, you might notice a red rash with swelling of the face, lips or lymph nodes in your neck or under your arms. This medicine does not prevent heart attack or stroke. In fact, this medicine may increase the chance of a heart attack or stroke. The chance may increase with longer use of this medicine and in people who have heart disease. If you take aspirin to prevent heart attack or stroke, talk with your doctor or healthcare provider. Do not take medicines such as ibuprofen and naproxen with this medicine. Side effects such as stomach upset, nausea, or ulcers may be more likely to occur. Many medicines available without a prescription  should not be taken with this medicine. This medicine can cause ulcers and bleeding in the stomach and intestines at any time during treatment. Do not smoke cigarettes or drink alcohol. These increase irritation to your stomach and can make it more susceptible to damage from this medicine. Ulcers and bleeding can happen without warning symptoms and can cause death. This medicine can cause you to bleed more easily. Try to avoid damage to your teeth and gums when you brush or floss your teeth. What side effects may I notice from receiving this medicine? Side effects that you should report to your doctor or health care professional as soon as possible:  allergic reactions like skin rash, itching or hives, swelling of the face, lips, or tongue  breathing problems  high blood pressure  nausea, vomiting  redness, blistering, peeling or loosening of the skin, including inside the mouth  severe stomach pain  signs and symptoms of bleeding such as bloody or black, tarry stools; red or dark-brown urine; spitting up blood or brown material that looks like coffee grounds; red spots on the  skin; unusual bruising or bleeding from the eye, gums, or nose  signs and symptoms of a blood clot changes in vision; chest pain; severe, sudden headache; trouble speaking; sudden numbness or weakness of the face, arm, or leg  trouble passing urine or change in the amount of urine  unexplained weight gain or swelling  unusually weak or tired  yellowing of eyes or skin Side effects that usually do not require medical attention (report to your doctor or health care professional if they continue or are bothersome):  diarrhea  dizziness  headache  heartburn This list may not describe all possible side effects. Call your doctor for medical advice about side effects. You may report side effects to FDA at 1-800-FDA-1088. Where should I keep my medicine? This drug is given in a hospital or clinic and will not be  stored at home. NOTE: This sheet is a summary. It may not cover all possible information. If you have questions about this medicine, talk to your doctor, pharmacist, or health care provider.  2020 Elsevier/Gold Standard (2018-12-02 15:10:53)   Ubrogepant tablets What is this medicine? UBROGEPANT (ue BROE je pant) is used to treat migraine headaches with or without aura. An aura is a strange feeling or visual disturbance that warns you of an attack. It is not used to prevent migraines. This medicine may be used for other purposes; ask your health care provider or pharmacist if you have questions. COMMON BRAND NAME(S): Roselyn Meier What should I tell my health care provider before I take this medicine? They need to know if you have any of these conditions:  kidney disease  liver disease  an unusual or allergic reaction to ubrogepant, other medicines, foods, dyes, or preservatives  pregnant or trying to get pregnant  breast-feeding How should I use this medicine? Take this medicine by mouth with a glass of water. Follow the directions on the prescription label. You can take it with or without food. If it upsets your stomach, take it with food. Take your medicine at regular intervals. Do not take it more often than directed. Do not stop taking except on your doctor's advice. Talk to your pediatrician about the use of this medicine in children. Special care may be needed. Overdosage: If you think you have taken too much of this medicine contact a poison control center or emergency room at once. NOTE: This medicine is only for you. Do not share this medicine with others. What if I miss a dose? This does not apply. This medicine is not for regular use. What may interact with this medicine? Do not take this medicine with any of the following medicines:  ceritinib  certain antibiotics like chloramphenicol, clarithromycin, telithromycin  certain antivirals for HIV like atazanavir, cobicistat,  darunavir, delavirdine, fosamprenavir, indinavir, ritonavir  certain medicines for fungal infections like itraconazole, ketoconazole, posaconazole, voriconazole  conivaptan  grapefruit  idelalisib  mifepristone  nefazodone  ribociclib This medicine may also interact with the following medications:  carvedilol  certain medicines for seizures like phenobarbital, phenytoin  ciprofloxacin  cyclosporine  eltrombopag  fluconazole  fluvoxamine  quinidine  rifampin  St. John's wort  verapamil This list may not describe all possible interactions. Give your health care provider a list of all the medicines, herbs, non-prescription drugs, or dietary supplements you use. Also tell them if you smoke, drink alcohol, or use illegal drugs. Some items may interact with your medicine. What should I watch for while using this medicine? Visit your health care professional for  regular checks on your progress. Tell your health care professional if your symptoms do not start to get better or if they get worse. Your mouth may get dry. Chewing sugarless gum or sucking hard candy and drinking plenty of water may help. Contact your health care professional if the problem does not go away or is severe. What side effects may I notice from receiving this medicine? Side effects that you should report to your doctor or health care professional as soon as possible:  allergic reactions like skin rash, itching or hives; swelling of the face, lips, or tongue Side effects that usually do not require medical attention (report these to your doctor or health care professional if they continue or are bothersome):  drowsiness  dry mouth  nausea  tiredness This list may not describe all possible side effects. Call your doctor for medical advice about side effects. You may report side effects to FDA at 1-800-FDA-1088. Where should I keep my medicine? Keep out of the reach of children. Store at room  temperature between 15 and 30 degrees C (59 and 86 degrees F). Throw away any unused medicine after the expiration date. NOTE: This sheet is a summary. It may not cover all possible information. If you have questions about this medicine, talk to your doctor, pharmacist, or health care provider.  2020 Elsevier/Gold Standard (2018-12-03 08:50:55) Vernell Barrier injection What is this medicine? FREMANEZUMAB (fre ma NEZ ue mab) is used to prevent migraine headaches. This medicine may be used for other purposes; ask your health care provider or pharmacist if you have questions. COMMON BRAND NAME(S): AJOVY What should I tell my health care provider before I take this medicine? They need to know if you have any of these conditions:  an unusual or allergic reaction to fremanezumab, other medicines, foods, dyes, or preservatives  pregnant or trying to get pregnant  breast-feeding How should I use this medicine? This medicine is for injection under the skin. You will be taught how to prepare and give this medicine. Use exactly as directed. Take your medicine at regular intervals. Do not take your medicine more often than directed. It is important that you put your used needles and syringes in a special sharps container. Do not put them in a trash can. If you do not have a sharps container, call your pharmacist or healthcare provider to get one. Talk to your pediatrician regarding the use of this medicine in children. Special care may be needed. Overdosage: If you think you have taken too much of this medicine contact a poison control center or emergency room at once. NOTE: This medicine is only for you. Do not share this medicine with others. What if I miss a dose? If you miss a dose, take it as soon as you can. If it is almost time for your next dose, take only that dose. Do not take double or extra doses. What may interact with this medicine? Interactions are not expected. This list may not describe  all possible interactions. Give your health care provider a list of all the medicines, herbs, non-prescription drugs, or dietary supplements you use. Also tell them if you smoke, drink alcohol, or use illegal drugs. Some items may interact with your medicine. What should I watch for while using this medicine? Tell your doctor or healthcare professional if your symptoms do not start to get better or if they get worse. What side effects may I notice from receiving this medicine? Side effects that you should report  to your doctor or health care professional as soon as possible:  allergic reactions like skin rash, itching or hives, swelling of the face, lips, or tongue Side effects that usually do not require medical attention (report these to your doctor or health care professional if they continue or are bothersome):  pain, redness, or irritation at site where injected This list may not describe all possible side effects. Call your doctor for medical advice about side effects. You may report side effects to FDA at 1-800-FDA-1088. Where should I keep my medicine? Keep out of the reach of children. You will be instructed on how to store this medicine. Throw away any unused medicine after the expiration date on the label. NOTE: This sheet is a summary. It may not cover all possible information. If you have questions about this medicine, talk to your doctor, pharmacist, or health care provider.  2020 Elsevier/Gold Standard (2017-06-16 17:22:56)

## 2020-03-15 ENCOUNTER — Other Ambulatory Visit: Payer: Self-pay | Admitting: *Deleted

## 2020-03-15 LAB — CBC
Hematocrit: 41.9 % (ref 34.0–46.6)
Hemoglobin: 14.1 g/dL (ref 11.1–15.9)
MCH: 31.4 pg (ref 26.6–33.0)
MCHC: 33.7 g/dL (ref 31.5–35.7)
MCV: 93 fL (ref 79–97)
Platelets: 272 10*3/uL (ref 150–450)
RBC: 4.49 x10E6/uL (ref 3.77–5.28)
RDW: 12.1 % (ref 11.7–15.4)
WBC: 7 10*3/uL (ref 3.4–10.8)

## 2020-03-15 LAB — TSH: TSH: 1.32 u[IU]/mL (ref 0.450–4.500)

## 2020-03-15 MED ORDER — ONDANSETRON HCL 8 MG PO TABS: 8.0000 mg | ORAL_TABLET | Freq: Three times a day (TID) | ORAL | 6 refills | Status: AC | PRN

## 2020-03-15 NOTE — Telephone Encounter (Signed)
We received a notice from [facility] stating Toradol 30 mg not covered, PA needed. I called [facility] to clarify as we had ordered the 60 mg vial and I was told the pharmacy receives the same message, NDC not covered, when trying to process that claim. I will attempt a PA but if this is not covered will see what Dr Lucia Gaskins recommends.

## 2020-03-15 NOTE — Telephone Encounter (Signed)
Ok to refill Ondansetron 8 mg TID PRN n/v/migraine #30 refills 6.

## 2020-03-15 NOTE — Telephone Encounter (Signed)
Opened in error

## 2020-03-16 ENCOUNTER — Telehealth: Payer: Self-pay | Admitting: *Deleted

## 2020-03-16 NOTE — Telephone Encounter (Signed)
Also received paper PA. I completed this and faxed back to [employer] along with office note. Received a receipt of confirmation. Awaiting determination.

## 2020-03-16 NOTE — Telephone Encounter (Signed)
Completed PA for Ajovy on Cover My Meds. Key: [id number]. Awaiting determination from [employer].

## 2020-03-21 ENCOUNTER — Telehealth: Payer: Self-pay | Admitting: Neurology

## 2020-03-21 NOTE — Telephone Encounter (Signed)
no to the covid questions MR Brain w/wo contrast Dr. Valaria Good Auth: NPR Ref # [id number]. Patient is scheduled at [facility] for 03/22/20.

## 2020-03-22 ENCOUNTER — Ambulatory Visit (INDEPENDENT_AMBULATORY_CARE_PROVIDER_SITE_OTHER): Payer: BC Managed Care – PPO

## 2020-03-22 ENCOUNTER — Other Ambulatory Visit: Payer: Self-pay

## 2020-03-22 DIAGNOSIS — H53453 Other localized visual field defect, bilateral: Secondary | ICD-10-CM | POA: Diagnosis not present

## 2020-03-22 DIAGNOSIS — H538 Other visual disturbances: Secondary | ICD-10-CM

## 2020-03-22 DIAGNOSIS — R51 Headache with orthostatic component, not elsewhere classified: Secondary | ICD-10-CM

## 2020-03-22 DIAGNOSIS — R519 Headache, unspecified: Secondary | ICD-10-CM | POA: Diagnosis not present

## 2020-03-22 MED ORDER — GADOBENATE DIMEGLUMINE 529 MG/ML IV SOLN
15.0000 mL | Freq: Once | INTRAVENOUS | Status: AC | PRN
  Administered 2020-03-22: 15 mL via INTRAVENOUS

## 2020-03-22 NOTE — Telephone Encounter (Signed)
Received fax from [employer]. Ajovy was not approved d/t pt not meeting requirements with tried/failed medications. Formulary alternative medications are Aimovig and Emgality. Pt should be able to use the savings card despite this denial. I sent pt a mychart message.   If we choose to discuss further, call [phone] within 90 days of 03/17/20.

## 2020-03-23 ENCOUNTER — Telehealth: Payer: Self-pay | Admitting: *Deleted

## 2020-03-23 NOTE — Progress Notes (Signed)
Please advise patient that her brain MRI with and without contrast was reported as normal.

## 2020-03-23 NOTE — Telephone Encounter (Signed)
-----   Message from Huston Foley, MD sent at 03/23/2020  4:56 PM EDT ----- Please advise patient that her brain MRI with and without contrast was reported as normal.

## 2020-07-18 ENCOUNTER — Ambulatory Visit: Payer: BC Managed Care – PPO | Admitting: Neurology

## 2020-07-31 HISTORY — PX: LABRAL REPAIR: SHX5172

## 2020-10-10 ENCOUNTER — Ambulatory Visit: Payer: BC Managed Care – PPO | Admitting: Neurology

## 2020-11-13 ENCOUNTER — Other Ambulatory Visit: Payer: Self-pay | Admitting: Chiropractic Medicine

## 2020-11-13 DIAGNOSIS — M545 Low back pain, unspecified: Secondary | ICD-10-CM

## 2020-11-26 ENCOUNTER — Ambulatory Visit
Admission: RE | Admit: 2020-11-26 | Discharge: 2020-11-26 | Disposition: A | Discharge status: Home / Self Care | Payer: BC Managed Care – PPO | Source: Ambulatory Visit | Attending: Chiropractic Medicine | Admitting: Chiropractic Medicine

## 2020-11-26 DIAGNOSIS — M545 Low back pain, unspecified: Secondary | ICD-10-CM

## 2021-05-19 ENCOUNTER — Other Ambulatory Visit: Payer: Self-pay

## 2021-05-19 ENCOUNTER — Emergency Department (HOSPITAL_BASED_OUTPATIENT_CLINIC_OR_DEPARTMENT_OTHER): Payer: BC Managed Care – PPO

## 2021-05-19 ENCOUNTER — Emergency Department (HOSPITAL_BASED_OUTPATIENT_CLINIC_OR_DEPARTMENT_OTHER)
Admission: EM | Admit: 2021-05-19 | Discharge: 2021-05-19 | Disposition: A | Discharge status: Home / Self Care | Payer: BC Managed Care – PPO | Attending: Emergency Medicine | Admitting: Emergency Medicine

## 2021-05-19 ENCOUNTER — Encounter (HOSPITAL_BASED_OUTPATIENT_CLINIC_OR_DEPARTMENT_OTHER): Payer: Self-pay

## 2021-05-19 DIAGNOSIS — Z79899 Other long term (current) drug therapy: Secondary | ICD-10-CM | POA: Insufficient documentation

## 2021-05-19 DIAGNOSIS — S3991XA Unspecified injury of abdomen, initial encounter: Secondary | ICD-10-CM | POA: Diagnosis not present

## 2021-05-19 DIAGNOSIS — R102 Pelvic and perineal pain: Secondary | ICD-10-CM

## 2021-05-19 DIAGNOSIS — I10 Essential (primary) hypertension: Secondary | ICD-10-CM | POA: Diagnosis not present

## 2021-05-19 DIAGNOSIS — E039 Hypothyroidism, unspecified: Secondary | ICD-10-CM | POA: Diagnosis not present

## 2021-05-19 DIAGNOSIS — X58XXXA Exposure to other specified factors, initial encounter: Secondary | ICD-10-CM | POA: Diagnosis not present

## 2021-05-19 DIAGNOSIS — R1031 Right lower quadrant pain: Secondary | ICD-10-CM

## 2021-05-19 DIAGNOSIS — S39011A Strain of muscle, fascia and tendon of abdomen, initial encounter: Secondary | ICD-10-CM | POA: Diagnosis not present

## 2021-05-19 DIAGNOSIS — Z7982 Long term (current) use of aspirin: Secondary | ICD-10-CM | POA: Insufficient documentation

## 2021-05-19 DIAGNOSIS — R109 Unspecified abdominal pain: Secondary | ICD-10-CM | POA: Diagnosis not present

## 2021-05-19 DIAGNOSIS — Z8742 Personal history of other diseases of the female genital tract: Secondary | ICD-10-CM | POA: Diagnosis not present

## 2021-05-19 LAB — COMPREHENSIVE METABOLIC PANEL
ALT: 25 U/L (ref 0–44)
AST: 27 U/L (ref 15–41)
Albumin: 4.7 g/dL (ref 3.5–5.0)
Alkaline Phosphatase: 50 U/L (ref 38–126)
Anion gap: 10 (ref 5–15)
BUN: 13 mg/dL (ref 6–20)
CO2: 25 mmol/L (ref 22–32)
Calcium: 9.2 mg/dL (ref 8.9–10.3)
Chloride: 102 mmol/L (ref 98–111)
Creatinine, Ser: 0.99 mg/dL (ref 0.44–1.00)
GFR, Estimated: >60 mL/min (ref >60)
Glucose, Bld: 106 mg/dL — ABNORMAL HIGH (ref 70–99)
Potassium: 3.3 mmol/L — ABNORMAL LOW (ref 3.5–5.1)
Sodium: 137 mmol/L (ref 135–145)
Total Bilirubin: 0.9 mg/dL (ref 0.3–1.2)
Total Protein: 7.8 g/dL (ref 6.5–8.1)

## 2021-05-19 LAB — URINALYSIS, ROUTINE W REFLEX MICROSCOPIC
Bilirubin Urine: NEGATIVE
Glucose, UA: NEGATIVE mg/dL
Hgb urine dipstick: NEGATIVE
Ketones, ur: NEGATIVE mg/dL
Leukocytes,Ua: NEGATIVE
Nitrite: NEGATIVE
Protein, ur: NEGATIVE mg/dL
Specific Gravity, Urine: 1.005 (ref 1.005–1.030)
pH: 6.5 (ref 5.0–8.0)

## 2021-05-19 LAB — CBC
HCT: 42.5 % (ref 36.0–46.0)
Hemoglobin: 14.6 g/dL (ref 12.0–15.0)
MCH: 31.7 pg (ref 26.0–34.0)
MCHC: 34.4 g/dL (ref 30.0–36.0)
MCV: 92.2 fL (ref 80.0–100.0)
Platelets: 331 10*3/uL (ref 150–400)
RBC: 4.61 MIL/uL (ref 3.87–5.11)
RDW: 11.9 % (ref 11.5–15.5)
WBC: 10.7 10*3/uL — ABNORMAL HIGH (ref 4.0–10.5)
nRBC: 0 % (ref 0.0–0.2)

## 2021-05-19 LAB — PREGNANCY, URINE: Preg Test, Ur: NEGATIVE

## 2021-05-19 LAB — LIPASE, BLOOD: Lipase: 37 U/L (ref 11–51)

## 2021-05-19 MED ORDER — IOHEXOL 300 MG/ML  SOLN
75.0000 mL | Freq: Once | INTRAMUSCULAR | Status: AC | PRN
  Administered 2021-05-19: 75 mL via INTRAVENOUS

## 2021-05-19 MED ORDER — KETOROLAC TROMETHAMINE 15 MG/ML IJ SOLN
15.0000 mg | Freq: Once | INTRAMUSCULAR | Status: AC
  Administered 2021-05-19: 15 mg via INTRAVENOUS
  Filled 2021-05-19: qty 1

## 2021-05-19 MED ORDER — METOCLOPRAMIDE HCL 5 MG/ML IJ SOLN
10.0000 mg | Freq: Four times a day (QID) | INTRAMUSCULAR | Status: DC | PRN
  Administered 2021-05-19: 10 mg via INTRAVENOUS
  Filled 2021-05-19: qty 2

## 2021-05-19 MED ORDER — FENTANYL CITRATE (PF) 100 MCG/2ML IJ SOLN
50.0000 ug | Freq: Once | INTRAMUSCULAR | Status: AC
Start: 2021-05-19 — End: 2021-05-19
  Administered 2021-05-19: 50 ug via INTRAVENOUS
  Filled 2021-05-19: qty 2

## 2021-05-19 MED ORDER — ONDANSETRON HCL 4 MG/2ML IJ SOLN
4.0000 mg | Freq: Once | INTRAMUSCULAR | Status: AC
  Administered 2021-05-19: 4 mg via INTRAVENOUS
  Filled 2021-05-19: qty 2

## 2021-05-19 MED ORDER — ONDANSETRON 4 MG PO TBDP: 4.0000 mg | ORAL_TABLET | Freq: Three times a day (TID) | ORAL | 0 refills | Status: AC | PRN

## 2021-05-19 MED ORDER — DROPERIDOL 2.5 MG/ML IJ SOLN
1.2500 mg | Freq: Once | INTRAMUSCULAR | Status: AC
  Administered 2021-05-19: 1.25 mg via INTRAVENOUS
  Filled 2021-05-19: qty 2

## 2021-05-19 NOTE — ED Triage Notes (Signed)
R sided abd pain x2 weeks; pt endorses pain during sex and increased pain today. Pt states that she has an appt with her GYN for this issue but the pain was too severe to wait

## 2021-05-19 NOTE — ED Provider Notes (Signed)
[facility] EMERGENCY DEPARTMENT Provider Note   CSN: [id number] Arrival date & time: 05/19/21  1831     History Chief Complaint  Patient presents with   Abdominal Pain    Patricia Shannon is a 45 y.o. female.  Presents to ER with concern for abdominal pain.  Patient reports that about 2 weeks ago she had an episode of pain during intercourse as well as some vaginal bleeding after intercourse.  She states that both of these symptoms have happened previously but they normally subside.  She made an appointment with her OB/GYN but has not seen them yet.  She has continued to have persistent pain.  Initially it was intermittent but now it is constant.  Sharp stabbing, feels like a knife.  10 out of 10.  Right lower abdomen.  No vaginal discharge.  Denies any history of STDs.  Has had a hysterectomy but still has ovaries.  States that she had her appendix removed at age 60.  HPI     Past Medical History:  Diagnosis Date   ADHD    pt is unaware of this diagnosis. it was listed in referral notes from [facility] office.   Anxiety    Colitis    Hypertension    Hypothyroidism    Kidney stones    kideny stone   Migraines    Renal stones    TIA (transient ischemic attack)    x60 at 45 years old    Patient Active Problem List   Diagnosis Date Noted   Chronic migraine without aura without status migrainosus, not intractable 03/14/2020    Past Surgical History:  Procedure Laterality Date   ABDOMINAL HYSTERECTOMY  2012   partial, due to endometriosis   appendectomy     COLONOSCOPY  09/12/2016   Samll internal hemorrhoids. Otherwise normal colonnoscopy. Highly redundant colon   DILATION AND CURETTAGE OF UTERUS     kidney stent  2014   KNEE SURGERY Left 2012   LABRAL REPAIR Right 07/31/2020   R hip   THYROIDECTOMY     subtotal     OB History   No obstetric history on file.     Family History  Problem Relation Age of Onset   Esophagitis Maternal Uncle        is  an alcoholic   Stroke Father        at 34 y.o.   CAD Father        triple bypass   Migraines Paternal Grandmother        bad headaches, not diagnosed with migraines but suspect    Stroke Paternal Grandfather        at 48 y.o.   Colon cancer Neg Hx     Social History   Tobacco Use   Smoking status: Never   Smokeless tobacco: Never  Vaping Use   Vaping Use: Never used  Substance Use Topics   Alcohol use: Yes    Comment: 1 glass every 3-4 months    Drug use: Never    Home Medications Prior to Admission medications   Medication Sig Start Date End Date Taking? Authorizing Provider  ALPRAZolam Prudy Feeler) 0.5 MG tablet 1 tablet daily. Usually at night 09/28/18  Yes [provider]  aspirin 81 MG EC tablet Adult Low Dose Aspirin   Yes [provider]  ondansetron (ZOFRAN ODT) 4 MG disintegrating tablet Take 1 tablet (4 mg total) by mouth every 8 (eight) hours as needed for nausea or vomiting.  05/19/21  Yes Milagros Loll, MD  valACYclovir (VALTREX) 500 MG tablet Take 500 mg by mouth daily.   Yes [provider]  valACYclovir (VALTREX) 500 MG tablet  06/26/16  Yes [provider]  ALPRAZolam Prudy Feeler) 0.5 MG tablet  08/06/16   [provider]  aspirin EC 81 MG tablet Take 81 mg by mouth daily.     [provider]  eletriptan (RELPAX) 40 MG tablet as needed. Patient not taking: Reported on 05/19/2021 05/20/17   [provider]  Fremanezumab-vfrm (AJOVY) 225 MG/1.5ML SOAJ Inject 225 mg into the skin every 30 (thirty) days. Patient not taking: Reported on 05/19/2021 03/14/20   Anson Fret, MD  ketorolac (TORADOL) 10 MG tablet Take 10 mg by mouth every 6 (six) hours as needed. Patient not taking: Reported on 05/19/2021 01/04/20   [provider]  ketorolac (TORADOL) 60 MG/2ML SOLN injection Inject 60 mg (2 mL) into the muscle every 6 hours as needed for headache. Max 2 doses (120 mg) in 24 hours. Do not use more than 4 days  per month. Patient not taking: Reported on 05/19/2021 03/14/20   Anson Fret, MD  loratadine (CLARITIN) 10 MG tablet Take 10 mg by mouth daily. Patient not taking: Reported on 05/19/2021    [provider]  methylPREDNISolone (MEDROL DOSEPAK) 4 MG TBPK tablet Take pills in the morning with food x 6 days Patient not taking: Reported on 05/19/2021 03/14/20   Anson Fret, MD  ondansetron (ZOFRAN) 8 MG tablet Take 1 tablet (8 mg total) by mouth 3 (three) times daily as needed for nausea or vomiting (migraine). 03/15/20   Anson Fret, MD  promethazine (PHENERGAN) 25 MG tablet promethazine 25 mg tablet 06/07/16   [provider]  Ubrogepant (UBRELVY) 100 MG TABS Take 100 mg by mouth every 2 (two) hours as needed. Maximum 200mg  a day. Patient not taking: Reported on 05/19/2021 03/14/20   03/16/20, MD    Allergies    Amoxicillin and Codeine  Review of Systems   Review of Systems  Constitutional:  Negative for chills and fever.  HENT:  Negative for ear pain and sore throat.   Eyes:  Negative for pain and visual disturbance.  Respiratory:  Negative for cough and shortness of breath.   Cardiovascular:  Negative for chest pain and palpitations.  Gastrointestinal:  Positive for abdominal pain. Negative for vomiting.  Genitourinary:  Positive for dyspareunia and vaginal bleeding. Negative for dysuria and hematuria.  Musculoskeletal:  Negative for arthralgias and back pain.  Skin:  Negative for color change and rash.  Neurological:  Negative for seizures and syncope.  All other systems reviewed and are negative.  Physical Exam Updated Vital Signs BP (!) 155/92 (BP Location: Left Arm)   Pulse 73   Temp 98.2 F (36.8 C) (Oral)   Resp 20   Ht 5\' 9"  (1.753 m)   Wt 69.9 kg   SpO2 96%   BMI 22.74 kg/m   Physical Exam Vitals and nursing note reviewed.  Constitutional:      General: She is not in acute distress.    Appearance: She is well-developed.  HENT:      Head: Normocephalic and atraumatic.  Eyes:     Conjunctiva/sclera: Conjunctivae normal.  Cardiovascular:     Rate and Rhythm: Normal rate and regular rhythm.     Heart sounds: No murmur heard. Pulmonary:     Effort: Pulmonary effort is normal. No respiratory distress.  Breath sounds: Normal breath sounds.  Abdominal:     Palpations: Abdomen is soft.     Tenderness: There is abdominal tenderness in the right lower quadrant.  Musculoskeletal:     Cervical back: Neck supple.  Skin:    General: Skin is warm and dry.  Neurological:     Mental Status: She is alert.    ED Results / Procedures / Treatments   Labs (all labs ordered are listed, but only abnormal results are displayed) Labs Reviewed  COMPREHENSIVE METABOLIC PANEL - Abnormal; Notable for the following components:      Result Value   Potassium 3.3 (*)    Glucose, Bld 106 (*)    All other components within normal limits  CBC - Abnormal; Notable for the following components:   WBC 10.7 (*)    All other components within normal limits  URINALYSIS, ROUTINE W REFLEX MICROSCOPIC - Abnormal; Notable for the following components:   Color, Urine STRAW (*)    All other components within normal limits  LIPASE, BLOOD  PREGNANCY, URINE    EKG None  Radiology CT ABDOMEN PELVIS W CONTRAST  Result Date: 05/19/2021 CLINICAL DATA:  Right lower quadrant abdominal pain. Left lower quadrant pain. History of appendectomy. Right-sided abdominal pain for 2 weeks. EXAM: CT ABDOMEN AND PELVIS WITH CONTRAST TECHNIQUE: Multidetector CT imaging of the abdomen and pelvis was performed using the standard protocol following bolus administration of intravenous contrast. CONTRAST:  75mL OMNIPAQUE IOHEXOL 300 MG/ML  SOLN COMPARISON:  None. FINDINGS: Lower chest: Lung bases are clear. Hepatobiliary: No focal liver abnormality is seen. No gallstones, gallbladder wall thickening, or biliary dilatation. Pancreas: Unremarkable. No pancreatic ductal  dilatation or surrounding inflammatory changes. Spleen: Normal in size without focal abnormality. Adrenals/Urinary Tract: Adrenal glands are unremarkable. Kidneys are normal, without renal calculi, focal lesion, or hydronephrosis. Bladder is unremarkable. Stomach/Bowel: Stomach, small bowel, and colon are not abnormally distended. No wall thickening or inflammatory changes are identified. The colon is diffusely stool-filled. Vascular/Lymphatic: No significant vascular findings are present. No enlarged abdominal or pelvic lymph nodes. Reproductive: Status post hysterectomy. No adnexal masses. Other: No free air or free fluid in the abdomen. Small periumbilical hernia containing fat. Musculoskeletal: Heterogeneous enlargement of the inferior right rectus abdominus muscle. This is most likely a intramuscular hematoma. An intramuscular mass lesion could also have this appearance and follow-up may be indicated if it does not resolve on treatment. No acute fractures demonstrated. IMPRESSION: 1. Heterogeneous enlargement of the right inferior rectus abdominus muscle, likely representing intramuscular hematoma. 2. No evidence of bowel obstruction or inflammation. Prominent stool fills the colon. 3. Minimal periumbilical hernia containing fat. Electronically Signed   By: Burman NievesWilliam  Stevens M.D.   On: 05/19/2021 21:33   US PELVIC COMPLETE W TRANSVAGINAL AND TORSION R/O  Result Date: 05/19/2021 CLINICAL DATA:  Right lower quadrant abdominal pain. Hysterectomy due to endometriosis. EXAM: TRANSABDOMINAL AND TRANSVAGINAL ULTRASOUND OF PELVIS DOPPLER ULTRASOUND OF OVARIES TECHNIQUE: Both transabdominal and transvaginal ultrasound examinations of the pelvis were performed. Transabdominal technique was performed for global imaging of the pelvis including uterus, ovaries, adnexal regions, and pelvic cul-de-sac. It was necessary to proceed with endovaginal exam following the transabdominal exam to visualize the ovaries. Color and  duplex Doppler ultrasound was utilized to evaluate blood flow to the ovaries. COMPARISON:  Pelvic ultrasound dated 12/21/2018. FINDINGS: Uterus Hysterectomy. Endometrium Hysterectomy. Right ovary Measurements: 3.4 x 2.4 x 1.9 cm = volume: 8 mL. Normal appearance/no adnexal mass. Left ovary Measurements: 2.4 x 1.8 x  2.3 cm = volume: 5.1 mL. Normal appearance/no adnexal mass. Pulsed Doppler evaluation of both ovaries demonstrates normal low-resistance arterial and venous waveforms. Other findings There is a 12 x 4 cm heterogeneous hypoechoic mass with internal vascularity in the anterior pelvic wall anterior to the bladder suspicious for an endometrioma. IMPRESSION: 1. Status post prior hysterectomy. 2. Unremarkable ovaries. 3. Probable endometrioma implanted in the anterior pelvic wall. Electronically Signed   By: Elgie Collard M.D.   On: 05/19/2021 20:50    Procedures Procedures   Medications Ordered in ED Medications  fentaNYL (SUBLIMAZE) injection 50 mcg (50 mcg Intravenous Given 05/19/21 1923)  ondansetron (ZOFRAN) injection 4 mg (4 mg Intravenous Given 05/19/21 1923)  droperidol (INAPSINE) 2.5 MG/ML injection 1.25 mg (1.25 mg Intravenous Given 05/19/21 2054)  ketorolac (TORADOL) 15 MG/ML injection 15 mg (15 mg Intravenous Given 05/19/21 2107)  iohexol (OMNIPAQUE) 300 MG/ML solution 75 mL (75 mLs Intravenous Contrast Given 05/19/21 2114)    ED Course  I have reviewed the triage vital signs and the nursing notes.  Pertinent labs & imaging results that were available during my care of the patient were reviewed by me and considered in my medical decision making (see chart for details).  Clinical Course as of 05/20/21 1511  Sat May 19, 2021  2154 Reassessed, no ongoing pain or nausea, reviewed return precautions and discharged [RD]    Clinical Course User Index [RD] Milagros Loll, MD   MDM Rules/Calculators/A&P                           45 year old presents to ER with concern for  right lower quadrant pain.  On exam she appears well in no distress but did appear somewhat uncomfortable from pain initially.  Had some tenderness in the right lower quadrant.  Check TVUS and basic labs.  TVUS noted normal ovaries, there is endometrioma.  Raises possibility of endometriosis.  Given continued pain however, checked CT scan to evaluate abdominal organs.  No acute intra abdominal pathology however radiologist commented on likely intramuscular hematoma in her inferior rectus muscle.  Patient denies any trauma to this region.  Her hemoglobin is normal.  Her vitals are stable.  For now recommend supportive care.  Instructed patient to follow-up both with her gynecologist and her primary care doctor.  Instructed patient that if her symptoms do not resolve over the next few days she should discuss with her primary care doctor about obtaining an MRI to further evaluate the area on the CT scan.  Patient and husband demonstrated good understanding.  Ultimately good pain and nausea control was achieved and patient was discharged home.  After the discussed management above, the patient was determined to be safe for discharge.  The patient was in agreement with this plan and all questions regarding their care were answered.  ED return precautions were discussed and the patient will return to the ED with any significant worsening of condition.  Final Clinical Impression(s) / ED Diagnoses Final diagnoses:  Pelvic pain in female  Strain of rectus abdominis muscle, initial encounter  RLQ abdominal pain    Rx / DC Orders ED Discharge Orders          Ordered    ondansetron (ZOFRAN ODT) 4 MG disintegrating tablet  Every 8 hours PRN        05/19/21 2156             Milagros Loll, MD 05/20/21 1511

## 2021-05-19 NOTE — ED Notes (Signed)
NV continue; sts vomiting started after receiving Fentanyl

## 2021-05-19 NOTE — Discharge Instructions (Addendum)
The ultrasound showed an endometrioma.  The CT scan showed likely hematoma in your rectus abdominus muscle.  Follow-up both with your gynecologist and your primary care doctor.  With your primary care doctor discussed this rectus abdominal hematoma.  If your symptoms do not resolve, you should be considered for an MRI of your abdomen to obtain further detail and rule out underlying mass.  With your gynecologist, please discuss the endometrioma as well as your pain.  Recommend taking anti-inflammatory such as ibuprofen and naproxen for pain control.  For nausea take the prescribed Zofran as needed.  If you develop uncontrolled pain, vomiting, fevers or other new concerning symptom, come back to ER for reassessment.

## 2021-05-25 DIAGNOSIS — R102 Pelvic and perineal pain: Secondary | ICD-10-CM | POA: Diagnosis not present

## 2021-05-25 DIAGNOSIS — N941 Unspecified dyspareunia: Secondary | ICD-10-CM | POA: Diagnosis not present

## 2021-05-25 DIAGNOSIS — N806 Endometriosis in cutaneous scar: Secondary | ICD-10-CM | POA: Diagnosis not present

## 2021-06-10 ENCOUNTER — Other Ambulatory Visit: Payer: Self-pay | Admitting: Neurology

## 2021-06-19 DIAGNOSIS — R1903 Right lower quadrant abdominal swelling, mass and lump: Secondary | ICD-10-CM | POA: Diagnosis not present

## 2021-06-19 DIAGNOSIS — N806 Endometriosis in cutaneous scar: Secondary | ICD-10-CM | POA: Diagnosis not present

## 2021-06-19 DIAGNOSIS — S3662XA Contusion of rectum, initial encounter: Secondary | ICD-10-CM | POA: Diagnosis not present

## 2021-06-19 DIAGNOSIS — M7981 Nontraumatic hematoma of soft tissue: Secondary | ICD-10-CM | POA: Diagnosis not present

## 2021-06-19 DIAGNOSIS — N736 Female pelvic peritoneal adhesions (postinfective): Secondary | ICD-10-CM | POA: Diagnosis not present

## 2021-06-19 DIAGNOSIS — R222 Localized swelling, mass and lump, trunk: Secondary | ICD-10-CM | POA: Diagnosis not present

## 2021-07-12 DIAGNOSIS — Z08 Encounter for follow-up examination after completed treatment for malignant neoplasm: Secondary | ICD-10-CM | POA: Diagnosis not present

## 2021-07-12 DIAGNOSIS — L905 Scar conditions and fibrosis of skin: Secondary | ICD-10-CM | POA: Diagnosis not present

## 2021-07-12 DIAGNOSIS — Z1283 Encounter for screening for malignant neoplasm of skin: Secondary | ICD-10-CM | POA: Diagnosis not present

## 2021-07-12 DIAGNOSIS — L821 Other seborrheic keratosis: Secondary | ICD-10-CM | POA: Diagnosis not present

## 2021-07-12 DIAGNOSIS — D225 Melanocytic nevi of trunk: Secondary | ICD-10-CM | POA: Diagnosis not present

## 2021-07-12 DIAGNOSIS — Z86006 Personal history of melanoma in-situ: Secondary | ICD-10-CM | POA: Diagnosis not present

## 2021-07-12 DIAGNOSIS — D485 Neoplasm of uncertain behavior of skin: Secondary | ICD-10-CM | POA: Diagnosis not present

## 2021-08-02 DIAGNOSIS — M79672 Pain in left foot: Secondary | ICD-10-CM | POA: Diagnosis not present

## 2021-08-11 DIAGNOSIS — R300 Dysuria: Secondary | ICD-10-CM | POA: Diagnosis not present

## 2021-08-11 DIAGNOSIS — N3090 Cystitis, unspecified without hematuria: Secondary | ICD-10-CM | POA: Diagnosis not present

## 2021-08-16 DIAGNOSIS — R1084 Generalized abdominal pain: Secondary | ICD-10-CM | POA: Diagnosis not present

## 2021-08-16 DIAGNOSIS — M6289 Other specified disorders of muscle: Secondary | ICD-10-CM | POA: Diagnosis not present

## 2021-08-16 DIAGNOSIS — K429 Umbilical hernia without obstruction or gangrene: Secondary | ICD-10-CM | POA: Diagnosis not present

## 2021-08-16 DIAGNOSIS — R310 Gross hematuria: Secondary | ICD-10-CM | POA: Diagnosis not present

## 2021-08-16 DIAGNOSIS — Z87442 Personal history of urinary calculi: Secondary | ICD-10-CM | POA: Diagnosis not present

## 2021-08-16 DIAGNOSIS — N200 Calculus of kidney: Secondary | ICD-10-CM | POA: Diagnosis not present

## 2021-08-17 DIAGNOSIS — R310 Gross hematuria: Secondary | ICD-10-CM | POA: Diagnosis not present

## 2021-08-17 DIAGNOSIS — K5900 Constipation, unspecified: Secondary | ICD-10-CM | POA: Diagnosis not present

## 2021-08-17 DIAGNOSIS — N200 Calculus of kidney: Secondary | ICD-10-CM | POA: Diagnosis not present

## 2021-08-29 DIAGNOSIS — R5383 Other fatigue: Secondary | ICD-10-CM | POA: Diagnosis not present

## 2021-08-29 DIAGNOSIS — J0100 Acute maxillary sinusitis, unspecified: Secondary | ICD-10-CM | POA: Diagnosis not present

## 2021-09-05 DIAGNOSIS — J0100 Acute maxillary sinusitis, unspecified: Secondary | ICD-10-CM | POA: Diagnosis not present

## 2021-09-05 DIAGNOSIS — J029 Acute pharyngitis, unspecified: Secondary | ICD-10-CM | POA: Diagnosis not present

## 2021-09-05 DIAGNOSIS — J040 Acute laryngitis: Secondary | ICD-10-CM | POA: Diagnosis not present

## 2021-09-21 DIAGNOSIS — Z1231 Encounter for screening mammogram for malignant neoplasm of breast: Secondary | ICD-10-CM | POA: Diagnosis not present

## 2021-10-07 IMAGING — US US PELVIS COMPLETE TRANSABD/TRANSVAG W DUPLEX
1 series · 13 of 25 positions shown · non-contrast
Comparison: Pelvic ultrasound dated 12/21/2018.

CLINICAL DATA: Right lower quadrant abdominal pain. Hysterectomy
due to endometriosis.

EXAM:
TRANSABDOMINAL AND TRANSVAGINAL ULTRASOUND OF PELVIS
DOPPLER ULTRASOUND OF OVARIES
TECHNIQUE: Both transabdominal and transvaginal ultrasound examinations of the
pelvis were performed. Transabdominal technique was performed for
global imaging of the pelvis including uterus, ovaries, adnexal
regions, and pelvic cul-de-sac.
It was necessary to proceed with endovaginal exam following the
transabdominal exam to visualize the ovaries. Color and duplex
Doppler ultrasound was utilized to evaluate blood flow to the
ovaries.

[Series 1: us pelvis complete transabd/transvag w duplex · 13 of 75 slices shown]
[im 1/75]
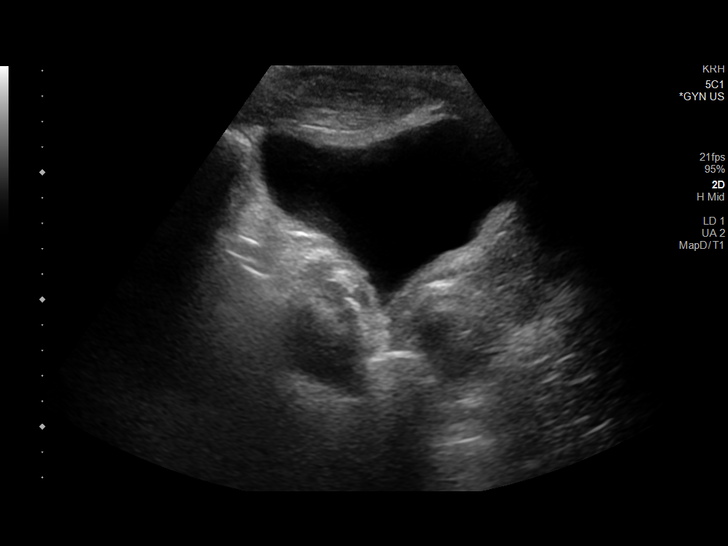
[im 7/75]
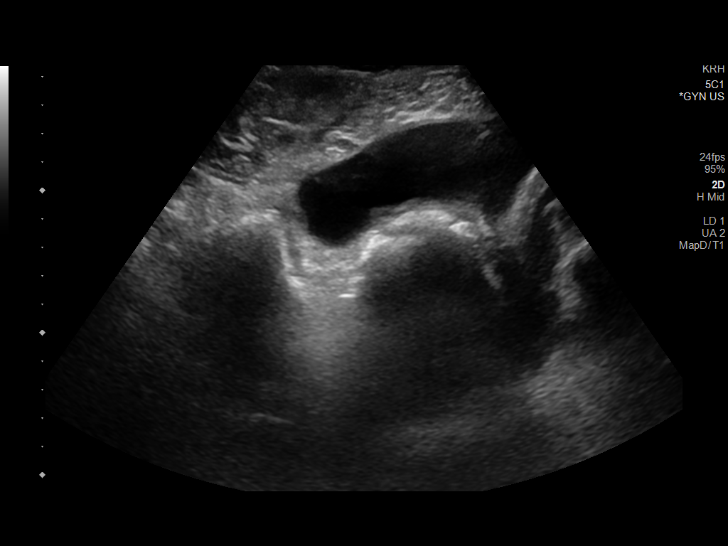
[im 13/75]
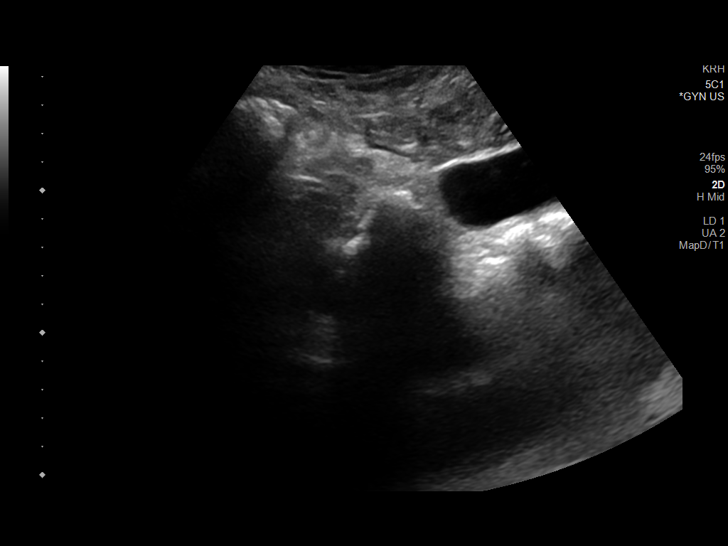
[im 19/75]
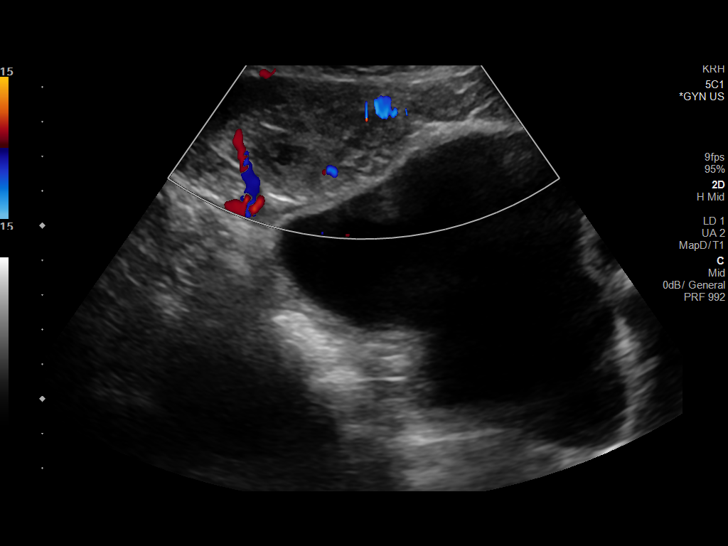
[im 25/75]
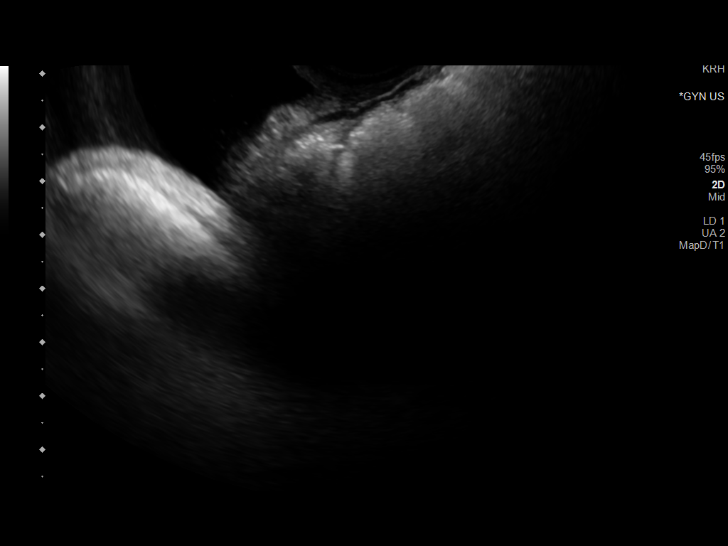
[im 31/75]
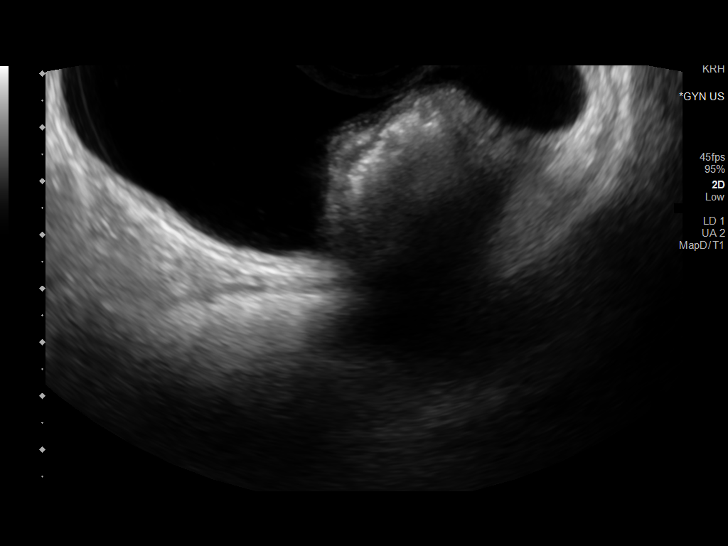
[im 38/75]
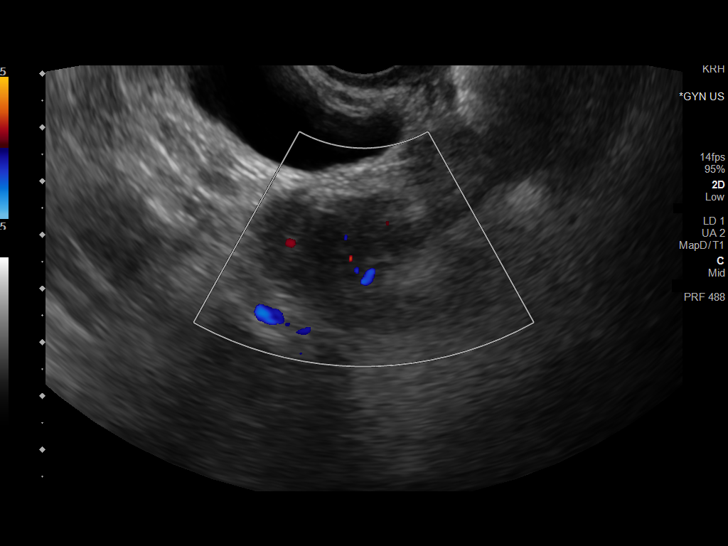
[im 44/75]
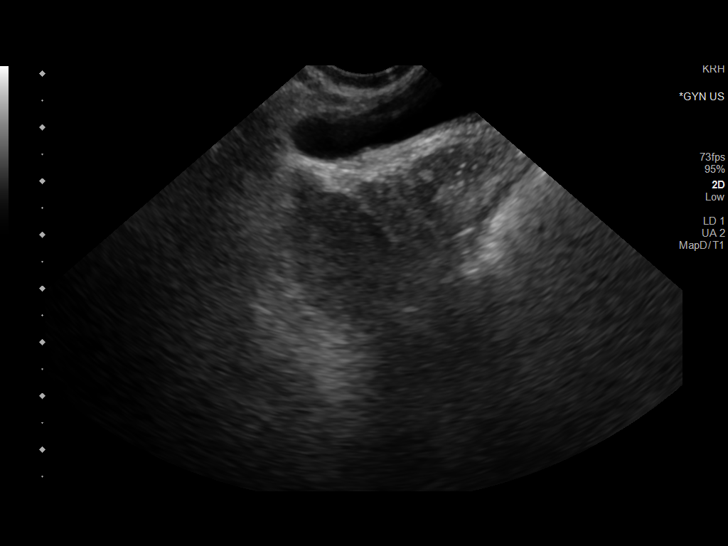
[im 50/75]
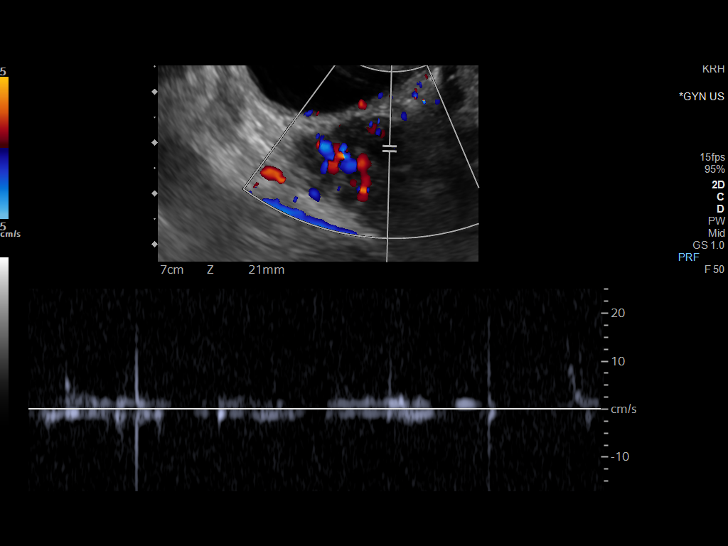
[im 56/75]
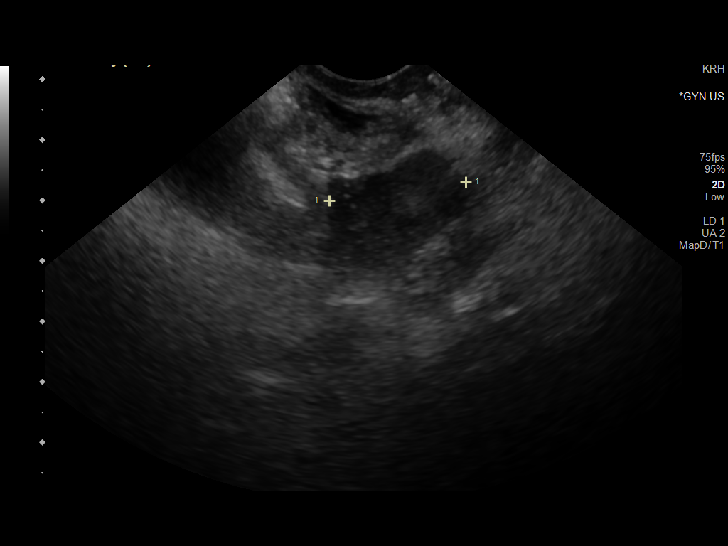
[im 62/75]
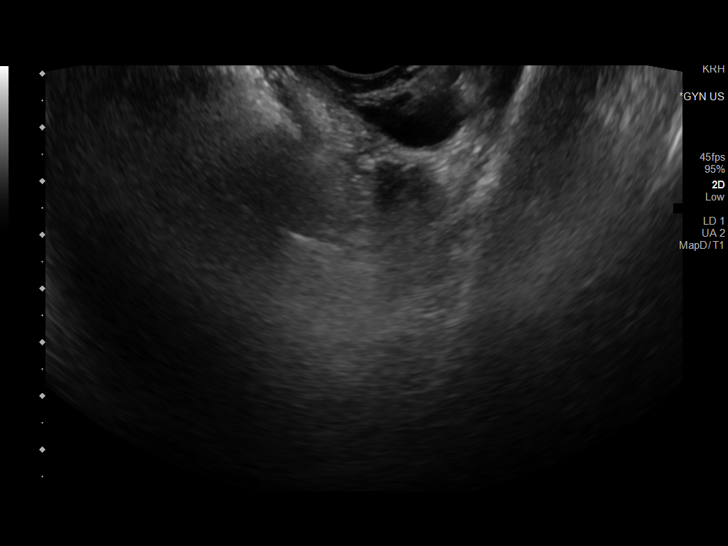
[im 68/75]
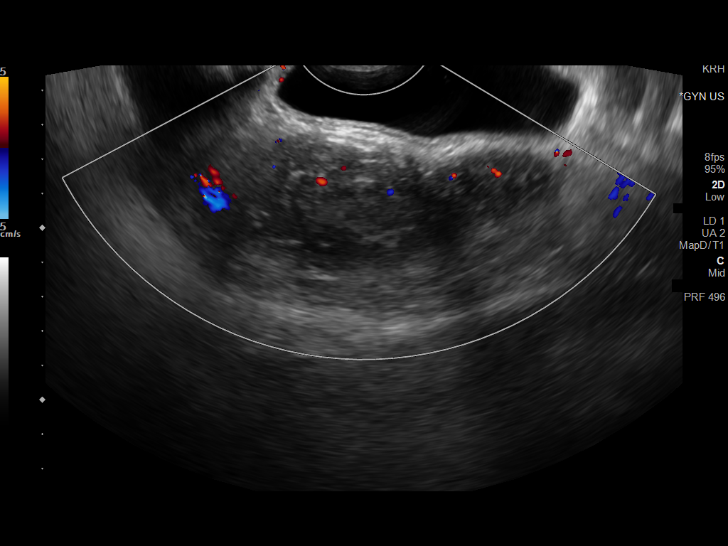
[im 75/75]
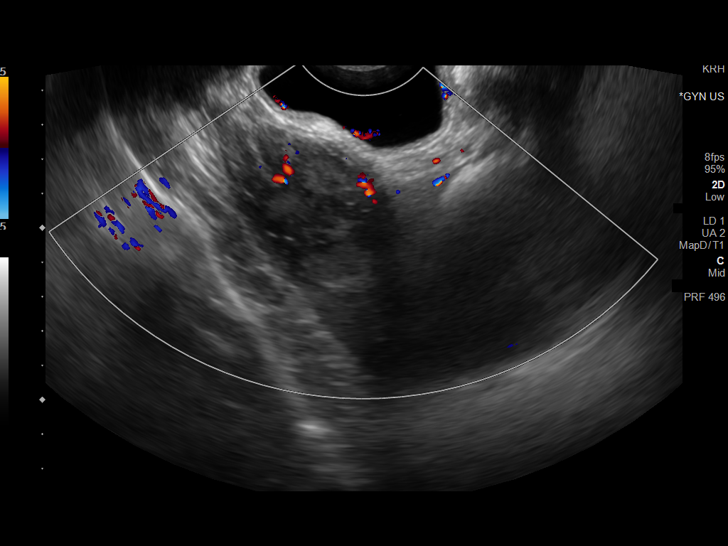

[13 of 25 positions shown; findings below may reference images not displayed]

FINDINGS: Uterus

Hysterectomy.

Endometrium

Hysterectomy.

Right ovary

Measurements: 3.4 x 2.4 x 1.9 cm = volume: 8 mL. Normal
appearance/no adnexal mass.

Left ovary

Measurements: 2.4 x 1.8 x 2.3 cm = volume: 5.1 mL. Normal
appearance/no adnexal mass.

Pulsed Doppler evaluation of both ovaries demonstrates normal
low-resistance arterial and venous waveforms.

Other findings

There is a 12 x 4 cm heterogeneous hypoechoic mass with internal
vascularity in the anterior pelvic wall anterior to the bladder
suspicious for an endometrioma.
IMPRESSION: 1. Status post prior hysterectomy.
2. Unremarkable ovaries.
3. Probable endometrioma implanted in the anterior pelvic wall.

## 2021-10-07 IMAGING — CT CT ABD-PELV W/ CM
2 of 5 series · 16 of 46 positions shown, 18 images · IV contrast (Omnipaque)
Comparison: None.

CLINICAL DATA: Right lower quadrant abdominal pain. Left lower
quadrant pain. History of appendectomy. Right-sided abdominal pain
for 2 weeks.

EXAM:
CT ABDOMEN AND PELVIS WITH CONTRAST
TECHNIQUE: Multidetector CT imaging of the abdomen and pelvis was performed
using the standard protocol following bolus administration of
intravenous contrast.
CONTRAST:  75mL OMNIPAQUE IOHEXOL 300 MG/ML  SOLN

[Series 2: axial st · axial · 0.83mm/px · z∈[-476,-56]mm · 13 of 94 slices shown, 15 images]
[im 5/94  soft-tissue]
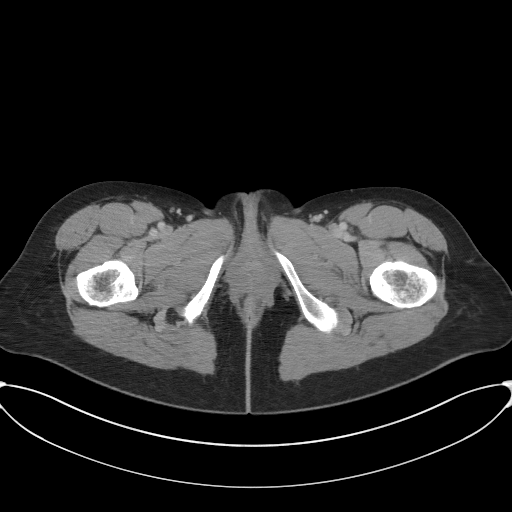
[im 5/94  bone]
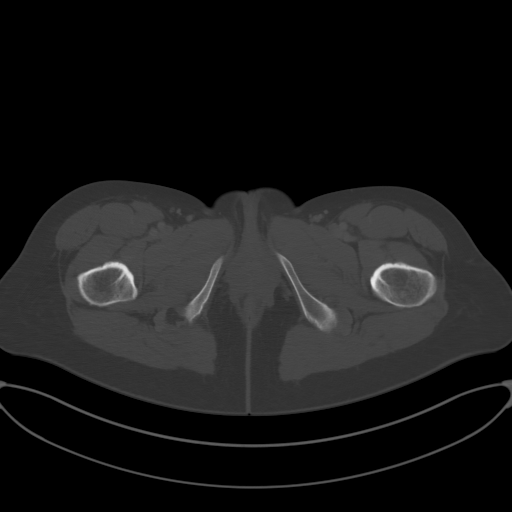
[im 15/94  soft-tissue]
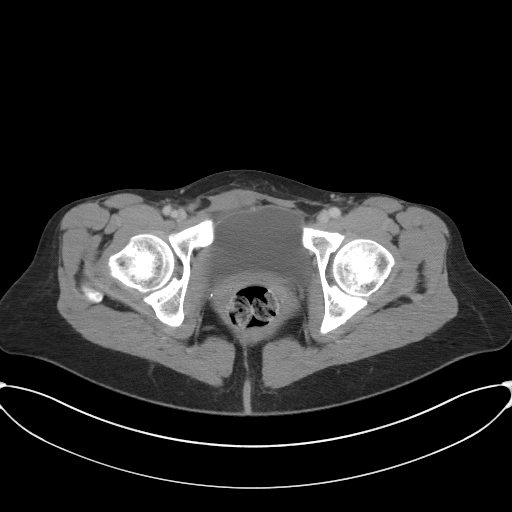
[im 20/94  soft-tissue]
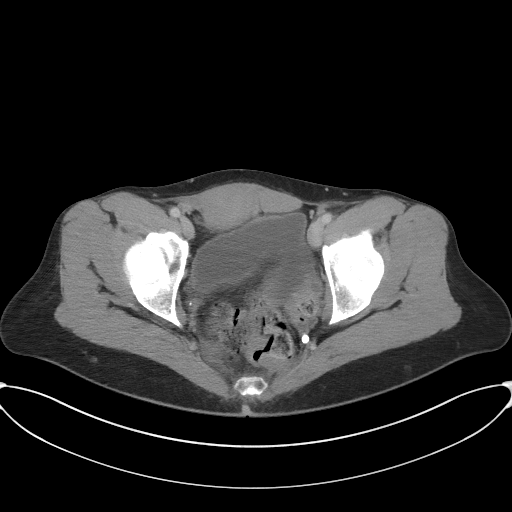
[im 25/94  soft-tissue]
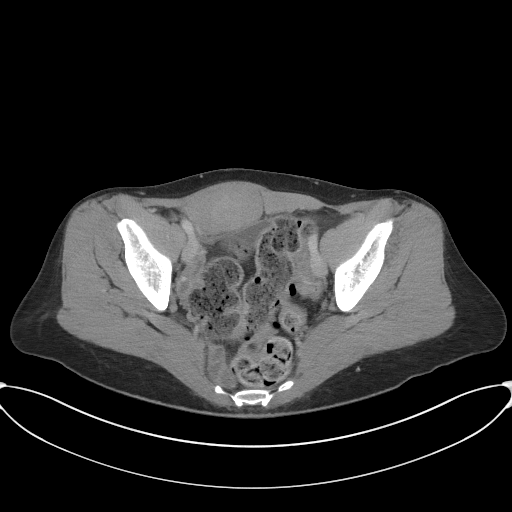
[im 35/94  soft-tissue]
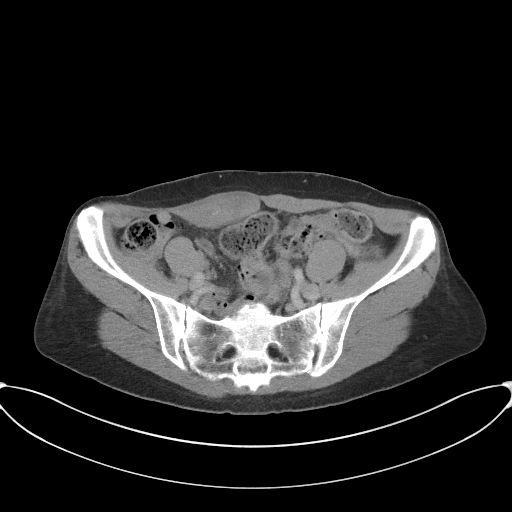
[im 40/94  soft-tissue]
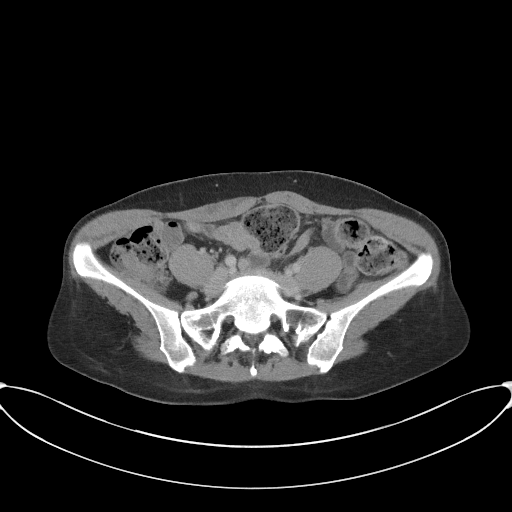
[im 49/94  soft-tissue]
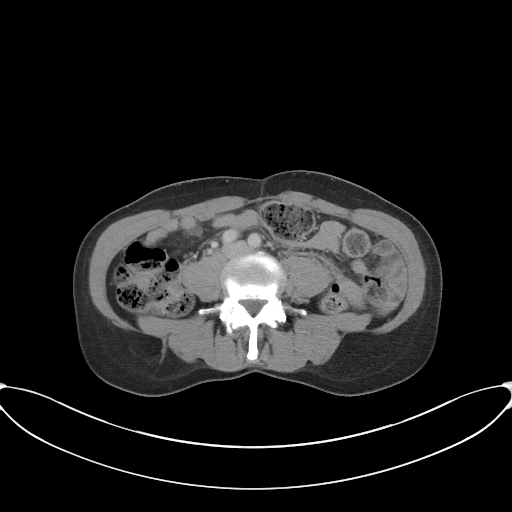
[im 54/94  soft-tissue]
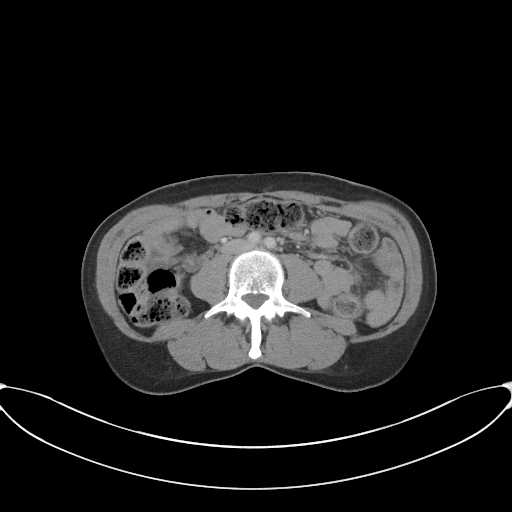
[im 59/94  soft-tissue]
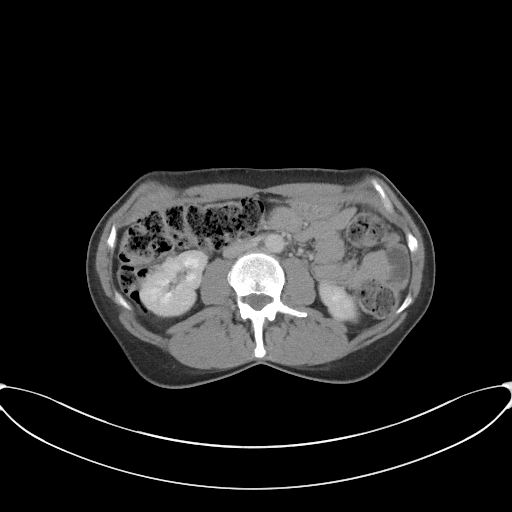
[im 59/94  bone]
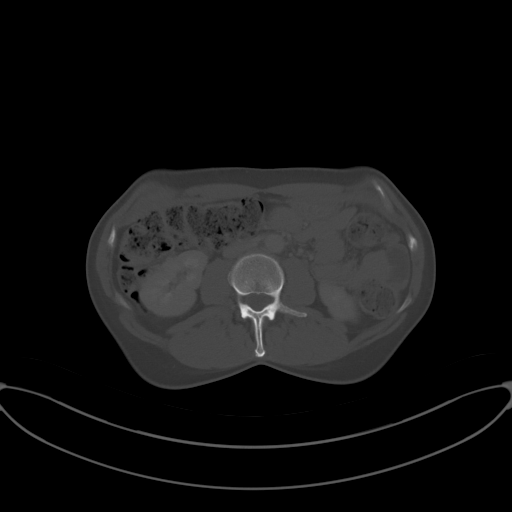
[im 69/94  soft-tissue]
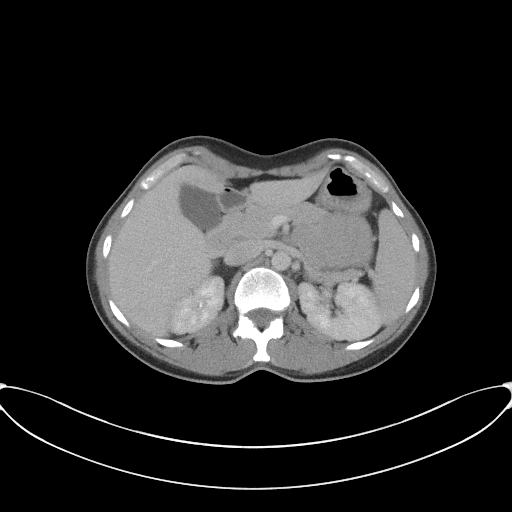
[im 74/94  soft-tissue]
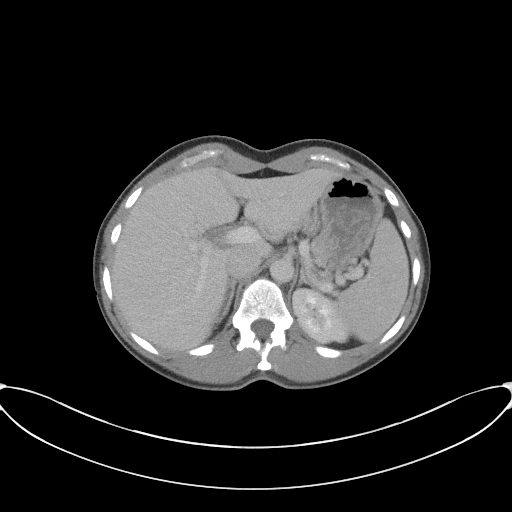
[im 79/94  soft-tissue]
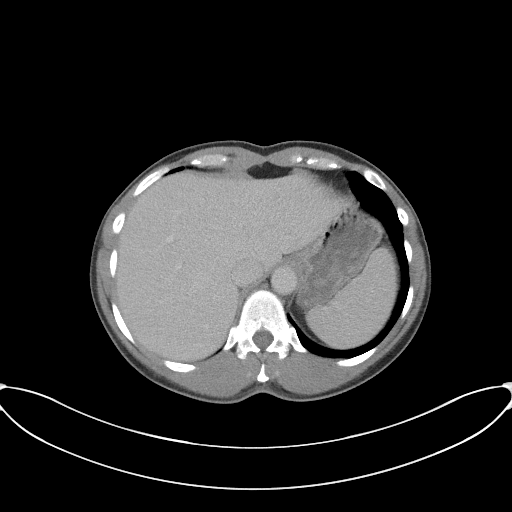
[im 89/94  soft-tissue]
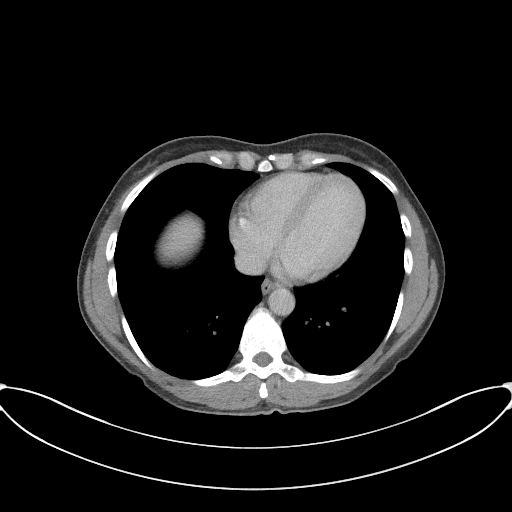

[Series 5: coronal st · coronal · 0.69mm/px · 3 of 81 slices shown]
[im 27/81  soft-tissue]
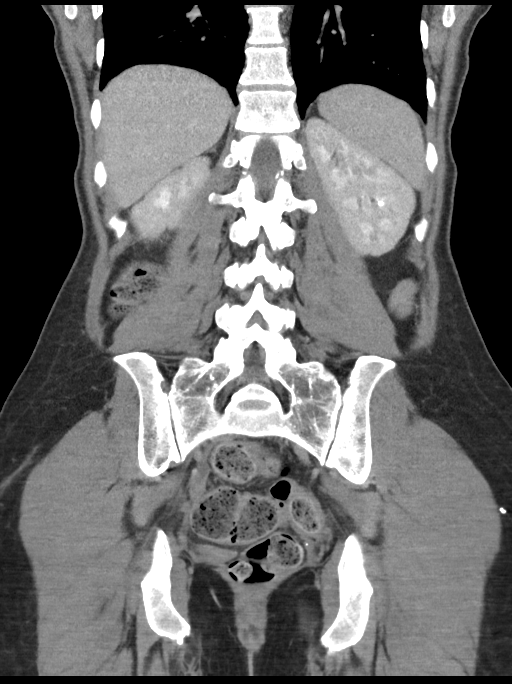
[im 36/81  soft-tissue]
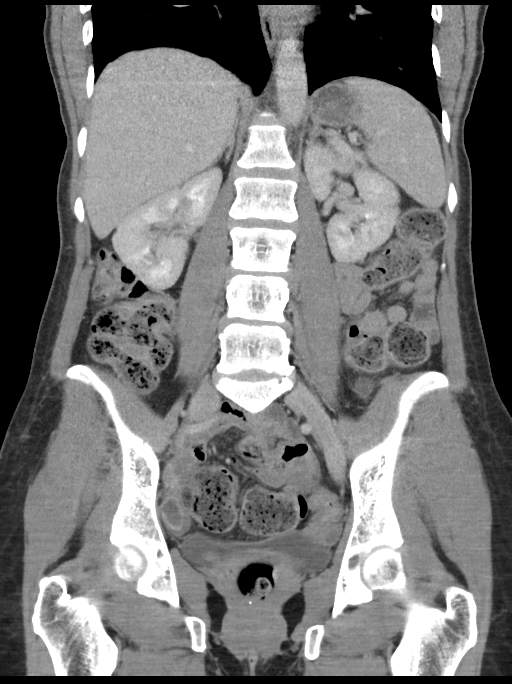
[im 45/81  soft-tissue]
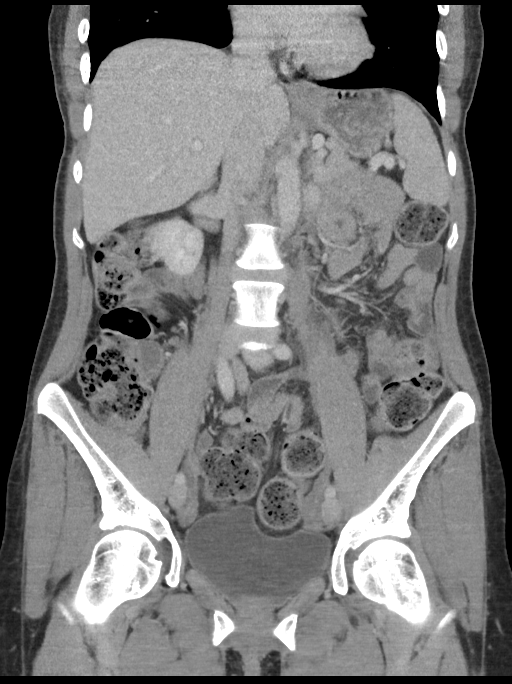

[16 of 46 positions shown; findings below may reference images not displayed]

FINDINGS: Lower chest: Lung bases are clear.

Hepatobiliary: No focal liver abnormality is seen. No gallstones,
gallbladder wall thickening, or biliary dilatation.

Pancreas: Unremarkable. No pancreatic ductal dilatation or
surrounding inflammatory changes.

Spleen: Normal in size without focal abnormality.

Adrenals/Urinary Tract: Adrenal glands are unremarkable. Kidneys are
normal, without renal calculi, focal lesion, or hydronephrosis.
Bladder is unremarkable.

Stomach/Bowel: Stomach, small bowel, and colon are not abnormally
distended. No wall thickening or inflammatory changes are
identified. The colon is diffusely stool-filled.

Vascular/Lymphatic: No significant vascular findings are present. No
enlarged abdominal or pelvic lymph nodes.

Reproductive: Status post hysterectomy. No adnexal masses.

Other: No free air or free fluid in the abdomen. Small periumbilical
hernia containing fat.

Musculoskeletal: Heterogeneous enlargement of the inferior right
rectus abdominus muscle. This is most likely a intramuscular
hematoma. An intramuscular mass lesion could also have this
appearance and follow-up may be indicated if it does not resolve on
treatment. No acute fractures demonstrated.
IMPRESSION: 1. Heterogeneous enlargement of the right inferior rectus abdominus
muscle, likely representing intramuscular hematoma.
2. No evidence of bowel obstruction or inflammation. Prominent stool
fills the colon.
3. Minimal periumbilical hernia containing fat.

## 2021-10-10 DIAGNOSIS — I10 Essential (primary) hypertension: Secondary | ICD-10-CM | POA: Diagnosis not present

## 2021-10-11 DIAGNOSIS — Z86006 Personal history of melanoma in-situ: Secondary | ICD-10-CM | POA: Diagnosis not present

## 2021-10-11 DIAGNOSIS — Z1283 Encounter for screening for malignant neoplasm of skin: Secondary | ICD-10-CM | POA: Diagnosis not present

## 2021-10-11 DIAGNOSIS — L821 Other seborrheic keratosis: Secondary | ICD-10-CM | POA: Diagnosis not present

## 2021-10-11 DIAGNOSIS — Z08 Encounter for follow-up examination after completed treatment for malignant neoplasm: Secondary | ICD-10-CM | POA: Diagnosis not present

## 2021-10-11 DIAGNOSIS — L905 Scar conditions and fibrosis of skin: Secondary | ICD-10-CM | POA: Diagnosis not present

## 2021-10-29 DIAGNOSIS — N390 Urinary tract infection, site not specified: Secondary | ICD-10-CM | POA: Diagnosis not present

## 2021-11-02 DIAGNOSIS — N898 Other specified noninflammatory disorders of vagina: Secondary | ICD-10-CM | POA: Diagnosis not present

## 2021-11-02 DIAGNOSIS — R102 Pelvic and perineal pain: Secondary | ICD-10-CM | POA: Diagnosis not present

## 2021-11-09 DIAGNOSIS — N390 Urinary tract infection, site not specified: Secondary | ICD-10-CM | POA: Diagnosis not present

## 2021-11-09 DIAGNOSIS — N200 Calculus of kidney: Secondary | ICD-10-CM | POA: Diagnosis not present

## 2021-11-09 DIAGNOSIS — R310 Gross hematuria: Secondary | ICD-10-CM | POA: Diagnosis not present

## 2021-11-21 DIAGNOSIS — K591 Functional diarrhea: Secondary | ICD-10-CM | POA: Diagnosis not present

## 2021-11-23 DIAGNOSIS — Z1331 Encounter for screening for depression: Secondary | ICD-10-CM | POA: Diagnosis not present

## 2021-11-23 DIAGNOSIS — I10 Essential (primary) hypertension: Secondary | ICD-10-CM | POA: Diagnosis not present

## 2021-11-23 DIAGNOSIS — G43909 Migraine, unspecified, not intractable, without status migrainosus: Secondary | ICD-10-CM | POA: Diagnosis not present

## 2021-11-23 DIAGNOSIS — Z6822 Body mass index (BMI) 22.0-22.9, adult: Secondary | ICD-10-CM | POA: Diagnosis not present

## 2021-11-29 DIAGNOSIS — N200 Calculus of kidney: Secondary | ICD-10-CM | POA: Diagnosis not present

## 2021-11-29 DIAGNOSIS — R310 Gross hematuria: Secondary | ICD-10-CM | POA: Diagnosis not present

## 2021-11-29 DIAGNOSIS — N390 Urinary tract infection, site not specified: Secondary | ICD-10-CM | POA: Diagnosis not present

## 2022-01-07 DIAGNOSIS — Z0000 Encounter for general adult medical examination without abnormal findings: Secondary | ICD-10-CM | POA: Diagnosis not present

## 2022-01-07 DIAGNOSIS — E782 Mixed hyperlipidemia: Secondary | ICD-10-CM | POA: Diagnosis not present

## 2022-01-07 DIAGNOSIS — Z6822 Body mass index (BMI) 22.0-22.9, adult: Secondary | ICD-10-CM | POA: Diagnosis not present

## 2022-01-07 DIAGNOSIS — I10 Essential (primary) hypertension: Secondary | ICD-10-CM | POA: Diagnosis not present

## 2022-01-17 DIAGNOSIS — D225 Melanocytic nevi of trunk: Secondary | ICD-10-CM | POA: Diagnosis not present

## 2022-01-17 DIAGNOSIS — Z1283 Encounter for screening for malignant neoplasm of skin: Secondary | ICD-10-CM | POA: Diagnosis not present

## 2022-01-17 DIAGNOSIS — D0372 Melanoma in situ of left lower limb, including hip: Secondary | ICD-10-CM | POA: Diagnosis not present

## 2022-01-17 DIAGNOSIS — Z85820 Personal history of malignant melanoma of skin: Secondary | ICD-10-CM | POA: Diagnosis not present

## 2022-01-17 DIAGNOSIS — L905 Scar conditions and fibrosis of skin: Secondary | ICD-10-CM | POA: Diagnosis not present

## 2022-01-17 DIAGNOSIS — Z08 Encounter for follow-up examination after completed treatment for malignant neoplasm: Secondary | ICD-10-CM | POA: Diagnosis not present

## 2022-01-20 DIAGNOSIS — R110 Nausea: Secondary | ICD-10-CM | POA: Diagnosis not present

## 2022-01-20 DIAGNOSIS — N3000 Acute cystitis without hematuria: Secondary | ICD-10-CM | POA: Diagnosis not present

## 2022-01-20 DIAGNOSIS — G43119 Migraine with aura, intractable, without status migrainosus: Secondary | ICD-10-CM | POA: Diagnosis not present

## 2022-01-28 DIAGNOSIS — Z6822 Body mass index (BMI) 22.0-22.9, adult: Secondary | ICD-10-CM | POA: Diagnosis not present

## 2022-01-28 DIAGNOSIS — R1011 Right upper quadrant pain: Secondary | ICD-10-CM | POA: Diagnosis not present

## 2022-02-01 DIAGNOSIS — L988 Other specified disorders of the skin and subcutaneous tissue: Secondary | ICD-10-CM | POA: Diagnosis not present

## 2022-02-01 DIAGNOSIS — D0372 Melanoma in situ of left lower limb, including hip: Secondary | ICD-10-CM | POA: Diagnosis not present

## 2022-02-06 ENCOUNTER — Encounter: Payer: Self-pay | Admitting: Plastic Surgery

## 2022-02-06 ENCOUNTER — Ambulatory Visit: Payer: BC Managed Care – PPO | Admitting: Plastic Surgery

## 2022-02-06 VITALS — BP 129/77 | HR 53 | Ht 69.0 in | Wt 159.2 lb

## 2022-02-06 DIAGNOSIS — L987 Excessive and redundant skin and subcutaneous tissue: Secondary | ICD-10-CM | POA: Diagnosis not present

## 2022-02-06 DIAGNOSIS — Z411 Encounter for cosmetic surgery: Secondary | ICD-10-CM

## 2022-02-06 NOTE — Progress Notes (Signed)
? ?Referring Provider ?Nonnie Done., MD ?[address] ?[place],  [place] [address]  ? ?CC:  ?Chief Complaint  ?Patient presents with  ? Advice Only  ?   ? ?Patricia Shannon is an 46 y.o. female.  ?HPI: Patient presents to discuss abdominoplasty with fat transfer to bilateral breast.  She feels that her shape of her breasts and abdomen were altered after multiple pregnancies and she is unhappy with how they look at this point.  Previous abdominal surgeries include appendectomy and partial hysterectomy along with a laparoscopic surgery to resume remove a small hematoma she is recovered fine from that.  She does not smoke and is not a diabetic.  She is a close friend of a patient of mine who had a similar procedure. ? ?Allergies  ?Allergen Reactions  ? Fentanyl Nausea And Vomiting  ?  Jaw locked up/ had to have a root canal the next day due to injury to a tooth.  ? Amoxicillin Other (See Comments)  ?   Upset stomach   ? Codeine Nausea Only  ?  unknown ?Pt said she can take with pheregan   ? ? ?Outpatient Encounter Medications as of 02/06/2022  ?Medication Sig  ? [DISCONTINUED] promethazine (PHENERGAN) 25 MG tablet Take by mouth.  ? ALPRAZolam (XANAX) 0.5 MG tablet   ? aspirin EC 81 MG tablet Take 81 mg by mouth daily.   ? eletriptan (RELPAX) 40 MG tablet as needed. (Patient not taking: Reported on 05/19/2021)  ? Fremanezumab-vfrm (AJOVY) 225 MG/1.5ML SOAJ Inject 225 mg into the skin every 30 (thirty) days. (Patient not taking: Reported on 05/19/2021)  ? loratadine (CLARITIN) 10 MG tablet Take 10 mg by mouth daily. (Patient not taking: Reported on 05/19/2021)  ? metoprolol succinate (TOPROL-XL) 50 MG 24 hr tablet Take 50 mg by mouth at bedtime.  ? mupirocin ointment (BACTROBAN) 2 % Apply topically 3 (three) times daily as needed.  ? ondansetron (ZOFRAN ODT) 4 MG disintegrating tablet Take 1 tablet (4 mg total) by mouth every 8 (eight) hours as needed for nausea or vomiting.  ? ondansetron (ZOFRAN) 8 MG tablet Take 1  tablet (8 mg total) by mouth 3 (three) times daily as needed for nausea or vomiting (migraine).  ? promethazine (PHENERGAN) 25 MG tablet promethazine 25 mg tablet  ? valACYclovir (VALTREX) 500 MG tablet   ? [DISCONTINUED] ALPRAZolam (XANAX) 0.5 MG tablet 1 tablet daily. Usually at night  ? [DISCONTINUED] aspirin 81 MG EC tablet Adult Low Dose Aspirin  ? [DISCONTINUED] ketorolac (TORADOL) 10 MG tablet Take 10 mg by mouth every 6 (six) hours as needed. (Patient not taking: Reported on 05/19/2021)  ? [DISCONTINUED] ketorolac (TORADOL) 60 MG/2ML SOLN injection Inject 60 mg (2 mL) into the muscle every 6 hours as needed for headache. Max 2 doses (120 mg) in 24 hours. Do not use more than 4 days per month. (Patient not taking: Reported on 05/19/2021)  ? [DISCONTINUED] methylPREDNISolone (MEDROL DOSEPAK) 4 MG TBPK tablet Take pills in the morning with food x 6 days (Patient not taking: Reported on 05/19/2021)  ? [DISCONTINUED] Ubrogepant (UBRELVY) 100 MG TABS Take 100 mg by mouth every 2 (two) hours as needed. Maximum 200mg  a day. (Patient not taking: Reported on 05/19/2021)  ? [DISCONTINUED] valACYclovir (VALTREX) 500 MG tablet Take 500 mg by mouth daily.  ? ?No facility-administered encounter medications on file as of 02/06/2022.  ?  ? ?Past Medical History:  ?Diagnosis Date  ? ADHD   ? pt is unaware  of this diagnosis. it was listed in referral notes from [facility] office.  ? Anxiety   ? Colitis   ? Hypertension   ? Hypothyroidism   ? Kidney stones   ? kideny stone  ? Migraines   ? Renal stones   ? TIA (transient ischemic attack)   ? x20 at 46 years old  ? ? ?Past Surgical History:  ?Procedure Laterality Date  ? ABDOMINAL HYSTERECTOMY  2012  ? partial, due to endometriosis  ? appendectomy    ? COLONOSCOPY  09/12/2016  ? Samll internal hemorrhoids. Otherwise normal colonnoscopy. Highly redundant colon  ? DILATION AND CURETTAGE OF UTERUS    ? kidney stent  2014  ? KNEE SURGERY Left 2012  ? LABRAL REPAIR Right 07/31/2020  ? R  hip  ? THYROIDECTOMY    ? subtotal  ? ? ?Family History  ?Problem Relation Age of Onset  ? Esophagitis Maternal Uncle   ?     is an alcoholic  ? Stroke Father   ?     at 33 y.o.  ? CAD Father   ?     triple bypass  ? Migraines Paternal Grandmother   ?     bad headaches, not diagnosed with migraines but suspect   ? Stroke Paternal Grandfather   ?     at 38 y.o.  ? Colon cancer Neg Hx   ? ? ?Social History  ? ?Social History Narrative  ? Lives at home with husband and kids  ? Right handed  ? Caffeine: 1 cup/day  ?  ? ?Review of Systems ?General: Denies fevers, chills, weight loss ?CV: Denies chest pain, shortness of breath, palpitations ? ?Physical Exam ? ?  02/06/2022  ? 11:30 AM 05/19/2021  ?  9:05 PM 05/19/2021  ?  6:42 PM  ?Vitals with BMI  ?Height 5\' 9"     ?Weight 159 lbs 3 oz    ?BMI 23.5    ?Systolic 129 155  ?Diastolic 77 92 107  ?Pulse 53 73   ?  ?General:  No acute distress,  Alert and oriented, Non-Toxic, Normal speech and affect ?Abdomen: Abdomen is soft nontender.  No obvious hernias.  Well-healed small scars from prior surgeries.  She has mild to moderate skin redundancy in the supra and infraumbilical areas.  Minimal excess adipose tissue.  Likely mild rectus diastases. ?Breast: She has essentially no ptosis.  Probably a or B cup in volume.  No obvious scars.  Reasonable symmetry. ? ?Assessment/Plan ?Patient presents to discuss aesthetic concerns regarding her breast and abdomen.  I do think she would be a reasonable candidate for abdominoplasty.  We discussed risks of this to include bleeding, infection, damage to surrounding structures need for additional procedures.  We discussed the location and orientation of the scars along with the potential for persistent contour irregularities.  We discussed breast enhancement with volume via implant and fat.  She does have a limited amount of autologous fat available to transfer.  She does not want a implant.  I do think it would be reasonable to obtain what  fat I can get from her abdomen and likely inner thighs to transfer to the breast area.  I did explain to her that the volume improvement from this would be subtle.  She is fully understanding and would like to move forward with the process. ? ?607 ?02/06/2022, 12:38 PM  ? ? ?  ?

## 2022-02-09 DIAGNOSIS — R1011 Right upper quadrant pain: Secondary | ICD-10-CM | POA: Diagnosis not present

## 2022-02-09 DIAGNOSIS — R197 Diarrhea, unspecified: Secondary | ICD-10-CM | POA: Diagnosis not present

## 2022-02-15 DIAGNOSIS — R1011 Right upper quadrant pain: Secondary | ICD-10-CM | POA: Diagnosis not present

## 2022-02-28 DIAGNOSIS — Z6822 Body mass index (BMI) 22.0-22.9, adult: Secondary | ICD-10-CM | POA: Diagnosis not present

## 2022-02-28 DIAGNOSIS — F419 Anxiety disorder, unspecified: Secondary | ICD-10-CM | POA: Diagnosis not present

## 2022-02-28 DIAGNOSIS — R1011 Right upper quadrant pain: Secondary | ICD-10-CM | POA: Diagnosis not present

## 2022-03-02 DIAGNOSIS — J0100 Acute maxillary sinusitis, unspecified: Secondary | ICD-10-CM | POA: Diagnosis not present

## 2022-03-07 ENCOUNTER — Telehealth: Payer: Self-pay | Admitting: Plastic Surgery

## 2022-03-07 NOTE — Telephone Encounter (Signed)
LVM to schedule pre and post operative appts.

## 2022-03-20 ENCOUNTER — Ambulatory Visit: Payer: BC Managed Care – PPO | Admitting: Gastroenterology

## 2022-03-22 ENCOUNTER — Ambulatory Visit
Admission: RE | Admit: 2022-03-22 | Discharge: 2022-03-22 | Disposition: A | Discharge status: Home / Self Care | Payer: BC Managed Care – PPO | Source: Ambulatory Visit | Attending: Chiropractic Medicine | Admitting: Chiropractic Medicine

## 2022-03-22 ENCOUNTER — Other Ambulatory Visit: Payer: Self-pay | Admitting: Chiropractic Medicine

## 2022-03-22 DIAGNOSIS — G8929 Other chronic pain: Secondary | ICD-10-CM

## 2022-03-22 DIAGNOSIS — M19072 Primary osteoarthritis, left ankle and foot: Secondary | ICD-10-CM | POA: Diagnosis not present

## 2022-03-22 DIAGNOSIS — M7732 Calcaneal spur, left foot: Secondary | ICD-10-CM | POA: Diagnosis not present

## 2022-03-28 ENCOUNTER — Ambulatory Visit (INDEPENDENT_AMBULATORY_CARE_PROVIDER_SITE_OTHER): Payer: Self-pay | Admitting: Surgical

## 2022-03-28 ENCOUNTER — Encounter: Payer: Self-pay | Admitting: Surgical

## 2022-03-28 VITALS — BP 163/78 | HR 80 | Ht 69.0 in | Wt 158.0 lb

## 2022-03-28 DIAGNOSIS — Z411 Encounter for cosmetic surgery: Secondary | ICD-10-CM

## 2022-03-28 MED ORDER — ONDANSETRON HCL 4 MG PO TABS: 4.0000 mg | ORAL_TABLET | Freq: Three times a day (TID) | ORAL | 0 refills | Status: AC | PRN

## 2022-03-28 MED ORDER — HYDROCODONE-ACETAMINOPHEN 5-325 MG PO TABS: 1.0000 | ORAL_TABLET | Freq: Four times a day (QID) | ORAL | 0 refills | Status: AC | PRN

## 2022-03-28 NOTE — Progress Notes (Signed)
Patient ID: Patricia Shannon, female    DOB: Aug 17, 1976, 46 y.o.   MRN: [id number]  Chief Complaint  Patient presents with   Pre-op Exam      ICD-10-CM   1. Encounter for cosmetic procedure  Z41.1       History of Present Illness: Patricia Shannon is a 46 y.o.  female  She presents for preoperative evaluation for upcoming procedure, Abdominoplasty with liposuction with Fat grafting to bilateral breast, scheduled for 04/08/2022 with Dr. Arita Miss.  The patient has had problems with anesthesia -reports severe nausea, otherwise no complications known per patient. No history of DVT/PE.  She does have a history of TIAs at the age of 61, thought to be related to Depo-Provera shot.  No issues since discontinuing Depo-Provera shot. No family history of DVT/PE.  No family or personal history of bleeding or clotting disorders.  Patient is not currently taking any blood thinners.  No history of CVA/MI.   Summary of Previous Visit: Previous abdominal surgeries include appendectomy and partial hysterectomy along with laparoscopic surgery to remove a small hematoma.  She does not smoke.  Not a diabetic.  PMH Significant for: Hypertension, migraines, history of TIA at 46 year old.  Patient reports she is overall feeling well, somewhat anxious for surgery.  She reports she is not sure if she would like to proceed with fat grafting to bilateral breast or not, she is unsure if the result will be worth the additional fee and surgical time.  She reports she would like to discuss this with Dr. Arita Miss today.  She reports no recent changes to her health.  She does report she is traveling to [place] the weekend prior to surgery.  She is on ASA 81 mg daily, however she recently stopped this about 2 weeks ago in preparation for Botox treatment and has not restarted this.  She plans to hold prior to surgery and for 24 to 48 hours postoperatively.   Past Medical History: Allergies: Allergies  Allergen Reactions    Fentanyl Nausea And Vomiting    Jaw locked up/ had to have a root canal the next day due to injury to a tooth.   Amoxicillin Other (See Comments)     Upset stomach    Codeine Nausea Only    unknown Pt said she can take with pheregan     Current Medications:  Current Outpatient Medications:    ALPRAZolam (XANAX) 0.5 MG tablet, , Disp: , Rfl:    aspirin EC 81 MG tablet, Take 81 mg by mouth daily. , Disp: , Rfl:    eletriptan (RELPAX) 40 MG tablet, as needed., Disp: , Rfl:    ondansetron (ZOFRAN ODT) 4 MG disintegrating tablet, Take 1 tablet (4 mg total) by mouth every 8 (eight) hours as needed for nausea or vomiting., Disp: 20 tablet, Rfl: 0   ondansetron (ZOFRAN) 8 MG tablet, Take 1 tablet (8 mg total) by mouth 3 (three) times daily as needed for nausea or vomiting (migraine)., Disp: 30 tablet, Rfl: 6   OVER THE COUNTER MEDICATION, Vitamin:Life Extension-Skin Restoring Ceramides-Take by mouth daily., Disp: , Rfl:    promethazine (PHENERGAN) 25 MG tablet, promethazine 25 mg tablet, Disp: , Rfl:    valACYclovir (VALTREX) 500 MG tablet, , Disp: , Rfl:   Past Medical Problems: Past Medical History:  Diagnosis Date   ADHD    pt is unaware of this diagnosis. it was listed in referral notes from [facility] office.   Anxiety  Colitis    Hypertension    Hypothyroidism    Kidney stones    kideny stone   Migraines    Renal stones    TIA (transient ischemic attack)    x57 at 46 years old    Past Surgical History: Past Surgical History:  Procedure Laterality Date   ABDOMINAL HYSTERECTOMY  2012   partial, due to endometriosis   appendectomy     COLONOSCOPY  09/12/2016   Samll internal hemorrhoids. Otherwise normal colonnoscopy. Highly redundant colon   DILATION AND CURETTAGE OF UTERUS     kidney stent  2014   KNEE SURGERY Left 2012   LABRAL REPAIR Right 07/31/2020   R hip   THYROIDECTOMY     subtotal    Social History: Social History   Socioeconomic History   Marital  status: Married    Spouse name: Not on file   Number of children: 3   Years of education: Not on file   Highest education level: Not on file  Occupational History   Occupation: RN  Tobacco Use   Smoking status: Never   Smokeless tobacco: Never  Vaping Use   Vaping Use: Never used  Substance and Sexual Activity   Alcohol use: Yes    Comment: 1 glass every 3-4 months    Drug use: Never   Sexual activity: Not on file  Other Topics Concern   Not on file  Social History Narrative   Lives at home with husband and kids   Right handed   Caffeine: 1 cup/day   Social Determinants of Health   Financial Resource Strain: Not on file  Food Insecurity: Not on file  Transportation Needs: Not on file  Physical Activity: Not on file  Stress: Not on file  Social Connections: Not on file  Intimate Partner Violence: Not on file    Family History: Family History  Problem Relation Age of Onset   Esophagitis Maternal Uncle        is an alcoholic   Stroke Father        at 35 y.o.   CAD Father        triple bypass   Migraines Paternal Grandmother        bad headaches, not diagnosed with migraines but suspect    Stroke Paternal Grandfather        at 1 y.o.   Colon cancer Neg Hx     Review of Systems: Review of Systems  Constitutional: Negative.   Respiratory: Negative.    Cardiovascular: Negative.   Gastrointestinal: Negative.   Neurological: Negative.     Physical Exam: Vital Signs BP (!) 163/78 (BP Location: Left Arm, Patient Position: Sitting, Cuff Size: Normal)   Pulse 80   Ht 5\' 9"  (1.753 m)   Wt 158 lb (71.7 kg)   SpO2 98%   BMI 23.33 kg/m   Physical Exam Constitutional:      General: Not in acute distress.    Appearance: Normal appearance. Not ill-appearing.  HENT:     Head: Normocephalic and atraumatic.  Eyes:     Pupils: Pupils are equal, round Neck:     Musculoskeletal: Normal range of motion.  Cardiovascular:     Rate and Rhythm: Normal rate     Pulses: Normal pulses.  Pulmonary:     Effort: Pulmonary effort is normal. No respiratory distress.  Abdominal:     General: Abdomen is flat. There is no distension.  Musculoskeletal: Normal range of motion.  Skin:  General: Skin is warm and dry.     Findings: No erythema or rash.  Neurological:     General: No focal deficit present.     Mental Status: Alert and oriented to person, place, and time. Mental status is at baseline.     Motor: No weakness.  Psychiatric:        Mood and Affect: Mood normal.        Behavior: Behavior normal.    Assessment/Plan: The patient is scheduled for Abdominoplasty with liposuction and  Fat grafting to bilateral breast with Dr. Arita Miss.  Risks, benefits, and alternatives of procedure discussed, questions answered and consent obtained.    Smoking Status: Non-smoker; Counseling Given?  N/A Last Mammogram: Reports last mammogram was December 2022; Results: Reports this was normal.  She reports she gets these every 6 months due to history of breast cysts and density of her breasts.  Caprini Score: 3, moderate; Risk Factors include: Age, and length of planned surgery. Recommendation for mechanical and pharmacological prophylaxis. Encourage early ambulation.   Pictures obtained: Pictures were obtained of the patient and placed in the chart with the patient's or guardian's permission.  Post-op Rx sent to pharmacy:  Norco, Zofran  Patient was provided with the General Surgical Risk consent document and Pain Medication Agreement prior to their appointment.  They had adequate time to read through the risk consent documents and Pain Medication Agreement. We also discussed them in person together during this preop appointment. All of their questions were answered to their satisfaction.  Recommended calling if they have any further questions.  Risk consent form and Pain Medication Agreement to be scanned into patient's chart.  The risk that can be encountered for  this procedure were discussed and include the following but not limited to these: asymmetry, fluid accumulation, firmness of the tissue, skin loss, decrease or no sensation, fat necrosis, bleeding, infection, healing delay.  Deep vein thrombosis, cardiac and pulmonary complications are risks to any procedure.  There are risks of anesthesia, changes to skin sensation and injury to nerves or blood vessels.  The muscle can be temporarily or permanently injured.  You may have an allergic reaction to tape, suture, glue, blood products which can result in skin discoloration, swelling, pain, skin lesions, poor healing.  Any of these can lead to the need for revisonal surgery or stage procedures.  Weight gain and weigh loss can also effect the long term appearance. The results are not guaranteed to last a lifetime.  Future surgery may be required.    The risks that can be encountered with and after liposuction were discussed and include the following but no limited to these:  Asymmetry, fluid accumulation, firmness of the area, fat necrosis with death of fat tissue, bleeding, infection, delayed healing, anesthesia risks, skin sensation changes, injury to structures including nerves, blood vessels, and muscles which may be temporary or permanent, allergies to tape, suture materials and glues, blood products, topical preparations or injected agents, skin and contour irregularities, skin discoloration and swelling, deep vein thrombosis, cardiac and pulmonary complications, pain, which may persist, persistent pain, recurrence of the lesion, poor healing of the incision, possible need for revisional surgery or staged procedures. Thiere can also be persistent swelling, poor wound healing, rippling or loose skin, worsening of cellulite, swelling, and thermal burn or heat injury from ultrasound with the ultrasound-assisted lipoplasty technique. Any change in weight fluctuations can alter the outcome.  We discussed risks  associated with fat grafting to bilateral breasts include  but not limited to: Failure of the fat graft resulting in minimal improvement in volume, fat necrosis, risks associated with liposuction including damage to surrounding structures, sensory changes.  Patient was allotted time today to discuss her concerns with Dr. Arita Miss related to the fat grafting to bilateral breast.  Electronically signed by: Evelena Leyden, PA-C 03/28/2022 2:34 PM

## 2022-03-29 ENCOUNTER — Telehealth: Payer: Self-pay | Admitting: Plastic Surgery

## 2022-03-29 NOTE — Telephone Encounter (Signed)
LVM returning patient's call regarding refund. Advised patient if she decides not to move forward with the fat transfer part of the procedure she can request a refund for that amount. Advised she can let the front desk know and they can advise the manager to initiate that refund. Advised that if she paid [facility] for that procedure as well, she would need to contact them regarding their refund process.

## 2022-04-07 ENCOUNTER — Ambulatory Visit (INDEPENDENT_AMBULATORY_CARE_PROVIDER_SITE_OTHER): Payer: Self-pay | Admitting: Plastic Surgery

## 2022-04-07 ENCOUNTER — Encounter: Payer: Self-pay | Admitting: Plastic Surgery

## 2022-04-07 DIAGNOSIS — Z719 Counseling, unspecified: Secondary | ICD-10-CM

## 2022-04-07 NOTE — Progress Notes (Signed)
Patient ID: Patricia Shannon, female    DOB: [birth year], 46 y.o.   MRN: [id number]   Chief Complaint  Patient presents with   Advice Only    The patient is a 46 year old female here with her husband for consultation for abdominal and breast surgery.  She was planning on having an abdominoplasty with fat transfer to bilateral breasts.  She has had a history of dense breast seen on mammogram with the need for mammograms or evaluations every 6 months.  She has had also had cysts and had concerns for breast cancer in the past although everything has been benign up to date.  She had 3 pregnancies with the kids now in their 22s.  Her previous abdominal surgery includes an appendectomy, partial hysterectomy and removal of hematoma after a labral tear with pelvic surgery.  She is not a smoker and does not have diabetes.  She is 5 feet 9 inches tall and weighs 159 pounds.      Review of Systems  Constitutional: Negative.   HENT: Negative.    Eyes: Negative.   Respiratory: Negative.    Cardiovascular: Negative.   Gastrointestinal: Negative.   Genitourinary: Negative.   Skin: Negative.   Neurological: Negative.   Hematological: Negative.   Psychiatric/Behavioral: Negative.      Past Medical History:  Diagnosis Date   ADHD    pt is unaware of this diagnosis. it was listed in referral notes from [facility] office.   Anxiety    Colitis    Hypertension    Hypothyroidism    Kidney stones    kideny stone   Migraines    Renal stones    TIA (transient ischemic attack)    x70 at 46 years old    Past Surgical History:  Procedure Laterality Date   ABDOMINAL HYSTERECTOMY  2012   partial, due to endometriosis   appendectomy     COLONOSCOPY  09/12/2016   Samll internal hemorrhoids. Otherwise normal colonnoscopy. Highly redundant colon   DILATION AND CURETTAGE OF UTERUS     kidney stent  2014   KNEE SURGERY Left 2012   LABRAL REPAIR Right 07/31/2020   R hip   THYROIDECTOMY      subtotal      Current Outpatient Medications:    ALPRAZolam (XANAX) 0.5 MG tablet, , Disp: , Rfl:    aspirin EC 81 MG tablet, Take 81 mg by mouth daily. , Disp: , Rfl:    eletriptan (RELPAX) 40 MG tablet, as needed., Disp: , Rfl:    ondansetron (ZOFRAN ODT) 4 MG disintegrating tablet, Take 1 tablet (4 mg total) by mouth every 8 (eight) hours as needed for nausea or vomiting., Disp: 20 tablet, Rfl: 0   ondansetron (ZOFRAN) 4 MG tablet, Take 1 tablet (4 mg total) by mouth every 8 (eight) hours as needed for nausea or vomiting., Disp: 20 tablet, Rfl: 0   ondansetron (ZOFRAN) 8 MG tablet, Take 1 tablet (8 mg total) by mouth 3 (three) times daily as needed for nausea or vomiting (migraine)., Disp: 30 tablet, Rfl: 6   OVER THE COUNTER MEDICATION, Vitamin:Life Extension-Skin Restoring Ceramides-Take by mouth daily., Disp: , Rfl:    promethazine (PHENERGAN) 25 MG tablet, promethazine 25 mg tablet, Disp: , Rfl:    valACYclovir (VALTREX) 500 MG tablet, , Disp: , Rfl:    Objective:   There were no vitals filed for this visit.  Physical Exam Constitutional:      Appearance: Normal appearance.  HENT:  Head: Normocephalic and atraumatic.  Cardiovascular:     Rate and Rhythm: Normal rate.  Pulmonary:     Effort: Pulmonary effort is normal.  Abdominal:     General: There is no distension.     Palpations: Abdomen is soft.     Tenderness: There is no abdominal tenderness.     Hernia: No hernia is present.  Musculoskeletal:        General: No swelling or deformity.  Skin:    General: Skin is warm.     Capillary Refill: Capillary refill takes less than 2 seconds.     Coloration: Skin is not jaundiced.     Findings: No bruising.  Neurological:     Mental Status: She is alert and oriented to person, place, and time.  Psychiatric:        Mood and Affect: Mood normal.        Behavior: Behavior normal.        Thought Content: Thought content normal.     Assessment & Plan:  Encounter for  counseling We had a lengthy discussion and reviewed the consent form for abdominoplasty.  She initialed and signed the form.  We will scanned that into the media section.  We discussed the risks and complications.  We also discussed the difference between a mini abdominoplasty and a full abdominoplasty.  We discussed the risks and complications with fat grafting to the breast.  She has decided not to have any of the breast surgery.  The abdominoplasty will be decided day of for what she needs.  If I can get out enough tissue will do the full abdominoplasty otherwise we will do the mini abdominoplasty.  Plication of the muscle will depend on whether or not we do the full abdominoplasty. The patient feels comfortable moving ahead with me as her surgeon.  Alena Bills Davanna He, DO

## 2022-04-08 ENCOUNTER — Telehealth: Payer: Self-pay | Admitting: Plastic Surgery

## 2022-04-08 DIAGNOSIS — Z411 Encounter for cosmetic surgery: Secondary | ICD-10-CM

## 2022-04-08 NOTE — Telephone Encounter (Signed)
Pt is calling in to see if she an get antibiotic or to see if she has one called in. Pt would like to have a call back.

## 2022-04-08 NOTE — Telephone Encounter (Signed)
LVM advised husband voicemail - Dr d advised patient may need a refund from original pricing -- will let him know once I know what difference in original vs actual surgery is..[phone]

## 2022-04-09 ENCOUNTER — Encounter: Payer: Self-pay | Admitting: Plastic Surgery

## 2022-04-10 ENCOUNTER — Encounter: Payer: Self-pay | Admitting: Gastroenterology

## 2022-04-13 ENCOUNTER — Encounter: Payer: Self-pay | Admitting: Plastic Surgery

## 2022-04-14 NOTE — Telephone Encounter (Signed)
Called to check on patient.  Overall doing well. Some pain but walking and doing well with water intake.  Can go into spanx if more comfortable than binder.  Plan to see her this week.

## 2022-04-15 ENCOUNTER — Other Ambulatory Visit: Payer: Self-pay | Admitting: Student

## 2022-04-15 ENCOUNTER — Encounter: Payer: Self-pay | Admitting: Plastic Surgery

## 2022-04-15 MED ORDER — DOXYCYCLINE HYCLATE 100 MG PO TABS: 100.0000 mg | ORAL_TABLET | Freq: Two times a day (BID) | ORAL | 0 refills | Status: AC

## 2022-04-15 NOTE — Progress Notes (Signed)
I called the patient regarding her MyChart message.  Patient states that she feels the drain site has been red for a few days, but reports today it looked a little more red and had some drainage coming out around it.  She states the drainage is whitish/yellowish.  Patient also reports she has had an intermittent fevers since surgery.  She reports that her temperature has gotten to 100.3 F a few times.  She denies her temperature getting higher than that.  Patient denies any headaches, chest pain, shortness of breath, or calf tenderness.  I discussed with the patient that I will call her in an antibiotic.  Doxycycline 100 mg twice daily was called in for the patient.  Discussed with the patient that she should drink plenty of water.  Patient states she has an appointment at 8:15 AM tomorrow morning.  Discussed with the patient to keep that appointment.  I also discussed with the patient that if her symptoms worsen, she spikes high fevers, develops chest pain or shortness of breath, that she should go to the emergency room to be evaluated.  Patient expressed understanding.

## 2022-04-16 ENCOUNTER — Ambulatory Visit (INDEPENDENT_AMBULATORY_CARE_PROVIDER_SITE_OTHER): Payer: Self-pay | Admitting: Plastic Surgery

## 2022-04-16 ENCOUNTER — Encounter: Payer: Self-pay | Admitting: Plastic Surgery

## 2022-04-16 DIAGNOSIS — Z719 Counseling, unspecified: Secondary | ICD-10-CM

## 2022-04-16 NOTE — Progress Notes (Signed)
The patient is a 46 year old female here for follow-up on her abdomen with surgery.  She is moving a little slow but doing really well with fluid and water intake.  She is not taking a whole lot in terms of pain meds.  She does have a little bit of nausea.  That may be related to food.  I do not think it is related to lack of water or her pain meds at this point.  Drain output is minimal but she still has a little bit of swelling so I would like to wait until Friday to take the drain out.  She can go into the spanks.  I took the dressing off and replaced the Steri-Strips.  She can start doing light massage.

## 2022-04-17 ENCOUNTER — Encounter: Payer: BC Managed Care – PPO | Admitting: Surgical

## 2022-04-19 ENCOUNTER — Encounter: Payer: Self-pay | Admitting: Physician Assistant

## 2022-04-19 ENCOUNTER — Ambulatory Visit (INDEPENDENT_AMBULATORY_CARE_PROVIDER_SITE_OTHER): Payer: Self-pay | Admitting: Physician Assistant

## 2022-04-19 DIAGNOSIS — Z411 Encounter for cosmetic surgery: Secondary | ICD-10-CM

## 2022-04-19 NOTE — Progress Notes (Signed)
Patricia Shannon is a 46 year old female seen today for follow-up evaluation status post panniculectomy by Dr. Ulice Bold on 04/08/2022.  She was last seen in our office on 04/16/2022.  At that visit she had minimal output from her drain but did have some swelling.  She was encouraged to follow-up in our office today for drain removal.  Overall she notes she has been doing well, she does note some tearfulness postoperatively given the changes, she has admittedly been online reading about the surgery which has caused some anxiety.  She notes some soreness but no severe pain, she did have an episode of a low-grade fever and was placed on doxycycline.  She denies any infectious symptoms today including any surrounding redness, discharge, fever, chills, nausea or vomiting.  On exam she has her Steri-Strips intact along the abdominal incision, no surrounding redness, discharge or warmth, she does have some ecchymosis along the lower abdomen and right flank.  She reports approximately 20 cc of output from her drain over the last 24 hours.  Her drain was removed today in the office, she tolerated this well.  I had a lengthy discussion with her regarding her surgery and expected outcome.  I did reinforce that she is very early on in her postoperative phase.  We would like to see the patient back in our office in 2 weeks for reevaluation.  I discussed strict return precautions in event she develops any new or worsening signs or symptoms, she verbalized understanding and agreement to today's plan had no further questions or concerns at today's visit.  A chaperone was present for the exam.

## 2022-04-21 ENCOUNTER — Encounter: Payer: Self-pay | Admitting: Plastic Surgery

## 2022-04-23 ENCOUNTER — Encounter: Payer: Self-pay | Admitting: Plastic Surgery

## 2022-04-25 ENCOUNTER — Ambulatory Visit (INDEPENDENT_AMBULATORY_CARE_PROVIDER_SITE_OTHER): Payer: Self-pay | Admitting: Physician Assistant

## 2022-04-25 DIAGNOSIS — Z9889 Other specified postprocedural states: Secondary | ICD-10-CM

## 2022-04-25 NOTE — Progress Notes (Signed)
This is a pleasant 46 year old female seen in our office for follow-up evaluation status post panniculectomy by Dr. Ulice Bold on 04/08/2022.  She is most recently seen in our office on 04/19/2022.  At that time she notes she had been doing well physically.  At that time her drain output was minimal with 20 cc of output over the last 24 hours.  The drain was removed, the patient tolerated well.  She notes that she was in her usual state of health without any significant changes until 2 days ago ( 7/25).  She notes that the day prior she returned to work, she noted some swelling on Tuesday morning and her lower abdomen just superior to her incision, she notes this is predominantly on the right side, she notes some minimal tenderness but no significant pain.  She denies any fever, redness, warmth or discharge.  She anticipated that the swelling would go down over the next several days, this did not happen so she presented to our office for evaluation.  She denies any infectious symptoms including fever, nausea or vomiting.  On exam the patient is sitting in the exam chair no acute distress, she has ecchymosis along the lower abdomen and right flank, her incision is covered in Steri-Strips, it is dry with no discharge.  There is no redness or warmth along the abdomen her incisions.  She does have fullness superior to her lower abdominal incision this is larger on the right when compared to the left with a positive fluid wave.  Given the fluid collection and swelling we proceeded with drainage at the bedside using a butterfly needle.  Risk and benefits were discussed the patient, she agreed to proceed.  We did remove approximately 75 cc of hemolyzed blood.  The patient tolerated this very well, the swelling was significantly improved afterwards.  The patient will follow-up at her next scheduled office visit on 05/03/2022 or sooner as needed.  She was given strict return precautions, she verbalized understanding and  agreement to today's plan had no further questions or concerns  Chaperone was present for today's visit.

## 2022-05-01 NOTE — Progress Notes (Deleted)
   Referring Provider Lise Auer, MD [address] [place],  [place] [address]   CC: No chief complaint on file.     Patricia Shannon is an 46 y.o. female.  HPI: ***  Review of Systems General: ***  Physical Exam    03/28/2022    1:44 PM 02/06/2022   11:30 AM 05/19/2021    9:05 PM  Vitals with BMI  Height 5\' 9"  5\' 9"    Weight 158 lbs 159 lbs 3 oz   BMI 23.32 23.5   Systolic 163 129  Diastolic 78 77 92  Pulse 80 53 73    General:  No acute distress,  Alert and oriented, Non-Toxic, Normal speech and affect ***   Assessment/Plan ***  Dacey Milberger 05/01/2022, 9:59 AM

## 2022-05-01 NOTE — Progress Notes (Signed)
This is a pleasant 46 year old female seen in our office for follow-up evaluation status post panniculectomy by Dr. Ulice Bold on 04/08/2022.  She was most recently seen in our office on 04/25/2022.  Prior to that visit she has been doing well, her drain was removed at the prior office visit with minimal output of 20 cc over the 24-hour period.  She tolerated that well, but notes she went back to work and shortly after that noted some swelling in her lower abdomen predominantly on the right side.  She did have some fluctuance on that side and we drained approximately 7 cc of hemolyzed blood at her last visit.  She tolerated this very well.  We encouraged her to follow-up today for repeat evaluation.  Since her last office visit she has been doing well, she notes some recurrence of swelling particularly in the right lower abdomen at the site of previous.,  She denies any infectious etiology, she notes the swelling today is much less severe when compared to her last office visit.  On exam the patient is sitting comfortably in the exam chair no acute distress.  She does have some improving ecchymosis along the lower abdomen and right flank.  Her incision was covered in Steri-Strips, these were removed which showed a clean dry and intact incision with routine healing, no discharge or signs of infection.  She did have some fullness in the right lower abdomen superior to her lower abdominal incision.  Given the fullness and what felt to be fluid in the right lower abdomen we did discuss risk and benefits of proceeding with drainage using a butterfly needle.  The patient agreed to proceed, the area was prepped with alcohol pad, the butterfly needle was inserted with no significant output noted.  Overall the patient has been progressing, she has no signs of infection or complicating features at this time.  She does have some fullness and swelling in the right lower abdomen but no drainable fluid collection.  The  patient is scheduled for follow-up evaluation with Dr. Ulice Bold, we have encouraged her to keep this follow-up appointment, if she develops any more swelling, or any concerning signs or symptoms she will reach out to our office and follow-up immediately.  The patient verbalized understanding and agreement to today's plan had no further questions or concerns.  A chaperone was present for today's exam.

## 2022-05-03 ENCOUNTER — Ambulatory Visit: Payer: BC Managed Care – PPO | Admitting: Physician Assistant

## 2022-05-03 ENCOUNTER — Encounter: Payer: Self-pay | Admitting: Physician Assistant

## 2022-05-03 DIAGNOSIS — Z9889 Other specified postprocedural states: Secondary | ICD-10-CM

## 2022-05-03 DIAGNOSIS — Z719 Counseling, unspecified: Secondary | ICD-10-CM

## 2022-05-07 ENCOUNTER — Telehealth: Payer: Self-pay | Admitting: *Deleted

## 2022-05-07 NOTE — Telephone Encounter (Signed)
Pt called stating she was seen Friday by Trey Paula. States right side of abdomen is more swollen and more tender. States possibly having an [place] was discussed at her Friday visit. Pt is requesting a call back. Please advise: [phone]

## 2022-05-08 ENCOUNTER — Ambulatory Visit (INDEPENDENT_AMBULATORY_CARE_PROVIDER_SITE_OTHER): Payer: Self-pay | Admitting: Physician Assistant

## 2022-05-08 DIAGNOSIS — Z9889 Other specified postprocedural states: Secondary | ICD-10-CM

## 2022-05-08 NOTE — Progress Notes (Signed)
This is a pleasant 46 year old female seen in our office for follow-up evaluation status post abdominoplasty by Dr. Ulice Bold on 04/08/2022.  She was most recently seen in our office on 05/03/2022.  Prior to that she was also seen on 04/25/2022 where she had 75 cc of hemolyzed blood removed from her right lower abdomen.  On her visit on 05/03/2022 no significant fluid collection was encountered.  She called our office reporting that she feels her weight circumference has increased by 1 inch and also has developed more swelling in the right lower abdomen.  Upon evaluation the patient reports that the right lower abdomen does feel more firm and full than at the time of her last office visit.  She denies any infectious symptoms.  On exam the patient has ecchymosis along the lower abdomen and right flank although improved compared to previous.  There is no redness or warmth to touch along the abdomen or incision.  There is some fullness in the right lower abdomen but no significant palpable fluid collection or positive fluid wave.   Given the patient's concern for fluid collection Dr. Ulice Bold did attempt to aspirate the area, approxi-1 cc of hemolyzed blood was noted, no significant fluid collection at this time.  Overall the patient is healing well her incision is clean dry and intact she has no infectious symptoms she does have some minimal swelling but no fluid collection.  She likely has some scar tissue in her right lower abdomen as she has had previous surgeries as well.  We would like to see the patient back in our office in 1 week for repeat evaluation or sooner as needed.  If the patient continues to have fullness in the right lower abdomen we would consider a ultrasound to verify no drainable fluid collection.  The patient was given strict return precautions, she verbalized understanding and agreement to today's plan had no further questions or concerns at today's visit. Chaperone was present  throughout exam.

## 2022-05-09 ENCOUNTER — Telehealth: Payer: Self-pay

## 2022-05-09 NOTE — Telephone Encounter (Signed)
Pt left voicemail requesting something to help her sleep. She adv that she took 2 benadryl last night and still only slept 4 hours. She also stated that the ativan is not working.   Call back # [phone].   Thanks!

## 2022-05-09 NOTE — Telephone Encounter (Signed)
Pt tried the benadryl with no luck. She would like to try the Ativan, which was discussed at her visit yesterday. Please advise.

## 2022-05-10 MED ORDER — LORAZEPAM 1 MG PO TABS: 1.0000 mg | ORAL_TABLET | Freq: Every day | ORAL | 0 refills | Status: AC

## 2022-05-10 NOTE — Telephone Encounter (Signed)
Ms. Gajda called stating she was still having trouble sleeping and has remained very emotional since surgery. She takes Xanax daily but finds that it has not helped her with sleep. I have sent in a prescription for Ativan to be taken at night and not in conjunction with her Xanax. She also discussed that she has started to look into options for therapy as she feels this may help her sort through her emotions. She will be seen in the clinic next Tuesday and I encouraged her to reach out at any time if she has any questions or concerns.

## 2022-05-13 NOTE — Progress Notes (Unsigned)
This is a 46 year old female seen in our office for follow-up evaluation status post abdominoplasty by Dr. Ulice Bold on 04/08/2022.

## 2022-05-14 ENCOUNTER — Ambulatory Visit (INDEPENDENT_AMBULATORY_CARE_PROVIDER_SITE_OTHER): Payer: Self-pay | Admitting: Physician Assistant

## 2022-05-14 ENCOUNTER — Encounter: Payer: Self-pay | Admitting: Physician Assistant

## 2022-05-14 DIAGNOSIS — Z9889 Other specified postprocedural states: Secondary | ICD-10-CM

## 2022-05-16 ENCOUNTER — Ambulatory Visit: Payer: BC Managed Care – PPO | Admitting: Gastroenterology

## 2022-05-24 ENCOUNTER — Telehealth: Payer: Self-pay | Admitting: *Deleted

## 2022-05-24 ENCOUNTER — Ambulatory Visit (INDEPENDENT_AMBULATORY_CARE_PROVIDER_SITE_OTHER): Payer: BC Managed Care – PPO | Admitting: Physician Assistant

## 2022-05-24 DIAGNOSIS — Z9889 Other specified postprocedural states: Secondary | ICD-10-CM

## 2022-05-24 NOTE — Progress Notes (Signed)
This is a pleasant 46 year old female seen in our office for follow-up evaluation status post abdominoplasty by Dr. Ulice Bold on 04/08/2022.  She was most recently seen in the office on 05/14/2022.  Since her last office visit she has been doing well, she notes that her emotions are more under control then she is less tearful.  She is working on getting better sleep.  She notes no significant change to firmness that she feels in the right lower abdomen.  She denies any issues with her incisions, she denies any factious signs or symptoms.  Chaperone present for exam.  On exam she has minimal firmness along the right lower abdomen, incision is clean dry and intact with routine healing.,  No fluid collections noted  Overall the patient is doing well, she continues to improve.  She still has an area of firmness that she is concerned about but is happy with the abdominal incision.  I did discuss that we would want to continue with gentle stage of the area of firmness, and give this time to settle down.  The patient would like to be seen again in 2 weeks to make sure she has close follow-up, I do think this is reasonable.  The patient may follow-up in our clinic in 2 weeks for repeat evaluation or sooner as needed.  She verbalized understanding and agreement to today's plan had no further questions or concerns.

## 2022-05-24 NOTE — Telephone Encounter (Signed)
Pt in office today and had question regarding refund that was to be issued s/p surgery with Dr. Ulice Bold. Message given to Wadie Lessen, practice administrator. Information received from Linde Gillis that refund was applied to other outstanding balances within the system. Pt informed and given numbers for customer service [phone]) and pt experience [phone]).

## 2022-05-31 DIAGNOSIS — F411 Generalized anxiety disorder: Secondary | ICD-10-CM | POA: Diagnosis not present

## 2022-05-31 DIAGNOSIS — I10 Essential (primary) hypertension: Secondary | ICD-10-CM | POA: Diagnosis not present

## 2022-05-31 DIAGNOSIS — R7989 Other specified abnormal findings of blood chemistry: Secondary | ICD-10-CM | POA: Diagnosis not present

## 2022-06-08 DIAGNOSIS — G43119 Migraine with aura, intractable, without status migrainosus: Secondary | ICD-10-CM | POA: Diagnosis not present

## 2022-06-10 ENCOUNTER — Ambulatory Visit (INDEPENDENT_AMBULATORY_CARE_PROVIDER_SITE_OTHER): Payer: BC Managed Care – PPO | Admitting: Physician Assistant

## 2022-06-10 DIAGNOSIS — Z9889 Other specified postprocedural states: Secondary | ICD-10-CM

## 2022-06-12 NOTE — Progress Notes (Signed)
This is a 46 year old female seen in our office for follow-up evaluation status post abdominoplasty by Dr. Ulice Bold on 04/08/2022.  She was most recently seen in the office on 05/24/2022.  Since her last office visit she notes she has been doing well.  She notes she has been sleeping in her bed and has been more active.  She notes some continued firmness in the right lower abdomen but feels that this goes away with massage.  She denies any infectious symptoms, she denies any pain.  Chaperone present on exam.  On exam she has minimal firmness along the right lower abdomen, incisions clean dry and intact with routine healing, no surrounding redness discharge or warmth, no fluid collections.  Overall the patient is doing well.  She does have some concerns of fullness in the right lower abdomen.  I did recommend that she continue the massages as these seem to help.  I do feel that giving her more time to massage would be beneficial.  She would like to seen 1 more time prior to her 90-day global period.  I will see her back in the office for repeat evaluation or sooner as needed.

## 2022-06-20 NOTE — Progress Notes (Signed)
This encounter was created in error - please disregard.

## 2022-06-25 ENCOUNTER — Ambulatory Visit (INDEPENDENT_AMBULATORY_CARE_PROVIDER_SITE_OTHER): Payer: BC Managed Care – PPO | Admitting: Physician Assistant

## 2022-06-25 ENCOUNTER — Ambulatory Visit: Payer: BC Managed Care – PPO | Admitting: Gastroenterology

## 2022-06-25 ENCOUNTER — Telehealth: Payer: Self-pay | Admitting: Physician Assistant

## 2022-06-25 DIAGNOSIS — Z9889 Other specified postprocedural states: Secondary | ICD-10-CM

## 2022-06-25 NOTE — Progress Notes (Signed)
This is a 46 year old female seen in our office for follow-up evaluation status post abdominoplasty by Dr. Marla Roe on 04/08/2022.  She was most recently seen in the office on 06/10/2022.  Since her last office visit she notes she has been doing well.  She does continue to have issues with her appearance and feels that she continues to have some excess tissue.  She denies any other significant complaints at today's visit.    Chaperone present on exam.  On exam she has no significant firmness, areas of fluid collection, redness warmth or incisional issues.  Dr. Marla Roe had the opportunity to personally evaluate the patient and discussed a plan moving forward.  The patient has had some changes postoperatively, Dr. Marla Roe feels that a procedure in the office to remove some excess tissue would be beneficial the patient wanted me to work with this.  The patient will think about this and will reach out to our office to plan for the procedure.  The patient verbalized understanding and agreement to this plan had no further questions or concerns.

## 2022-06-25 NOTE — Telephone Encounter (Signed)
Pt is calling back in to see what procedure she will be having in he office and how soon she can get it done.  Pt would like to have a call back to let her know.  Per Dr. Marla Roe she would like for [facility] to give the pt a call.

## 2022-06-25 NOTE — Telephone Encounter (Signed)
Thanks, I tried to call her. I left a message, I will try again tomorrow.

## 2022-07-10 DIAGNOSIS — F411 Generalized anxiety disorder: Secondary | ICD-10-CM | POA: Diagnosis not present

## 2022-07-10 DIAGNOSIS — F331 Major depressive disorder, recurrent, moderate: Secondary | ICD-10-CM | POA: Diagnosis not present

## 2022-07-10 DIAGNOSIS — F4010 Social phobia, unspecified: Secondary | ICD-10-CM | POA: Diagnosis not present

## 2022-07-24 DIAGNOSIS — F411 Generalized anxiety disorder: Secondary | ICD-10-CM | POA: Diagnosis not present

## 2022-07-24 DIAGNOSIS — F331 Major depressive disorder, recurrent, moderate: Secondary | ICD-10-CM | POA: Diagnosis not present

## 2022-07-24 DIAGNOSIS — F4010 Social phobia, unspecified: Secondary | ICD-10-CM | POA: Diagnosis not present

## 2022-07-31 DIAGNOSIS — F331 Major depressive disorder, recurrent, moderate: Secondary | ICD-10-CM | POA: Diagnosis not present

## 2022-07-31 DIAGNOSIS — F4010 Social phobia, unspecified: Secondary | ICD-10-CM | POA: Diagnosis not present

## 2022-07-31 DIAGNOSIS — F411 Generalized anxiety disorder: Secondary | ICD-10-CM | POA: Diagnosis not present

## 2022-08-08 DIAGNOSIS — Z1283 Encounter for screening for malignant neoplasm of skin: Secondary | ICD-10-CM | POA: Diagnosis not present

## 2022-08-08 DIAGNOSIS — Z08 Encounter for follow-up examination after completed treatment for malignant neoplasm: Secondary | ICD-10-CM | POA: Diagnosis not present

## 2022-08-08 DIAGNOSIS — Z85820 Personal history of malignant melanoma of skin: Secondary | ICD-10-CM | POA: Diagnosis not present

## 2022-08-08 DIAGNOSIS — D2272 Melanocytic nevi of left lower limb, including hip: Secondary | ICD-10-CM | POA: Diagnosis not present

## 2022-08-08 DIAGNOSIS — D225 Melanocytic nevi of trunk: Secondary | ICD-10-CM | POA: Diagnosis not present

## 2022-08-09 DIAGNOSIS — F331 Major depressive disorder, recurrent, moderate: Secondary | ICD-10-CM | POA: Diagnosis not present

## 2022-08-09 DIAGNOSIS — F4010 Social phobia, unspecified: Secondary | ICD-10-CM | POA: Diagnosis not present

## 2022-08-09 DIAGNOSIS — F411 Generalized anxiety disorder: Secondary | ICD-10-CM | POA: Diagnosis not present

## 2022-08-15 DIAGNOSIS — N3001 Acute cystitis with hematuria: Secondary | ICD-10-CM | POA: Diagnosis not present

## 2022-08-15 DIAGNOSIS — N3090 Cystitis, unspecified without hematuria: Secondary | ICD-10-CM | POA: Diagnosis not present

## 2022-08-28 DIAGNOSIS — F411 Generalized anxiety disorder: Secondary | ICD-10-CM | POA: Diagnosis not present

## 2022-08-28 DIAGNOSIS — F4010 Social phobia, unspecified: Secondary | ICD-10-CM | POA: Diagnosis not present

## 2022-08-28 DIAGNOSIS — F331 Major depressive disorder, recurrent, moderate: Secondary | ICD-10-CM | POA: Diagnosis not present

## 2022-08-29 DIAGNOSIS — R519 Headache, unspecified: Secondary | ICD-10-CM | POA: Diagnosis not present

## 2022-08-29 DIAGNOSIS — H6691 Otitis media, unspecified, right ear: Secondary | ICD-10-CM | POA: Diagnosis not present

## 2022-08-29 DIAGNOSIS — J019 Acute sinusitis, unspecified: Secondary | ICD-10-CM | POA: Diagnosis not present

## 2022-09-04 DIAGNOSIS — F411 Generalized anxiety disorder: Secondary | ICD-10-CM | POA: Diagnosis not present

## 2022-09-04 DIAGNOSIS — F331 Major depressive disorder, recurrent, moderate: Secondary | ICD-10-CM | POA: Diagnosis not present

## 2022-09-04 DIAGNOSIS — F4010 Social phobia, unspecified: Secondary | ICD-10-CM | POA: Diagnosis not present

## 2022-09-06 DIAGNOSIS — F419 Anxiety disorder, unspecified: Secondary | ICD-10-CM | POA: Diagnosis not present

## 2022-09-06 DIAGNOSIS — Z6822 Body mass index (BMI) 22.0-22.9, adult: Secondary | ICD-10-CM | POA: Diagnosis not present

## 2022-09-06 DIAGNOSIS — G43909 Migraine, unspecified, not intractable, without status migrainosus: Secondary | ICD-10-CM | POA: Diagnosis not present

## 2022-09-11 DIAGNOSIS — F4010 Social phobia, unspecified: Secondary | ICD-10-CM | POA: Diagnosis not present

## 2022-09-11 DIAGNOSIS — F411 Generalized anxiety disorder: Secondary | ICD-10-CM | POA: Diagnosis not present

## 2022-09-11 DIAGNOSIS — F331 Major depressive disorder, recurrent, moderate: Secondary | ICD-10-CM | POA: Diagnosis not present

## 2022-09-13 ENCOUNTER — Telehealth (INDEPENDENT_AMBULATORY_CARE_PROVIDER_SITE_OTHER): Payer: Self-pay | Admitting: Plastic Surgery

## 2022-09-13 DIAGNOSIS — Z719 Counseling, unspecified: Secondary | ICD-10-CM

## 2022-09-13 NOTE — Progress Notes (Signed)
   Subjective:    Patient ID: Patricia Shannon, female    DOB: 1976/01/25, 46 y.o.   MRN: [id number]  The patient is a 46 year old female joining me by phone for further discussion about her abdomen.  She had a mini abdominoplasty and has some residual fullness so would like to have modification.  I think this is reasonable.  We are going to arrange a time where I will will have around an hour an hour and a half to do this.  We are tentatively planning for January 17.      Review of Systems  Constitutional: Negative.   HENT: Negative.    Eyes: Negative.   Respiratory: Negative.    Cardiovascular: Negative.   Gastrointestinal: Negative.   Endocrine: Negative.   Genitourinary: Negative.   Musculoskeletal: Negative.        Objective:   Physical Exam        Assessment & Plan:     ICD-10-CM   1. Encounter for counseling  Z71.9        I connected with  Alanson Puls on 09/13/22 by phone and verified that I am speaking with the correct person using two identifiers. The patient was at home and I was at the office.  We spent 5 minutes in discussion.   I discussed the limitations of evaluation and management by telemedicine. The patient expressed understanding and agreed to proceed.  Plan to do revision abdominoplasty in the office on January 17.

## 2022-09-18 DIAGNOSIS — F331 Major depressive disorder, recurrent, moderate: Secondary | ICD-10-CM | POA: Diagnosis not present

## 2022-09-18 DIAGNOSIS — F411 Generalized anxiety disorder: Secondary | ICD-10-CM | POA: Diagnosis not present

## 2022-09-18 DIAGNOSIS — F4010 Social phobia, unspecified: Secondary | ICD-10-CM | POA: Diagnosis not present

## 2022-09-20 ENCOUNTER — Ambulatory Visit: Payer: BC Managed Care – PPO | Admitting: Plastic Surgery

## 2022-09-24 DIAGNOSIS — Z1231 Encounter for screening mammogram for malignant neoplasm of breast: Secondary | ICD-10-CM | POA: Diagnosis not present

## 2022-10-11 ENCOUNTER — Ambulatory Visit (INDEPENDENT_AMBULATORY_CARE_PROVIDER_SITE_OTHER): Payer: Self-pay | Admitting: Plastic Surgery

## 2022-10-11 DIAGNOSIS — Z719 Counseling, unspecified: Secondary | ICD-10-CM

## 2022-10-11 NOTE — Progress Notes (Signed)
The patient is a 47 year old female here with her husband for follow-up after a mini abdominoplasty.  She was scheduled for revision next week.  She has now canceled that and wants to wait.  We talked a little bit more about what we could do to even out the bulge she feels that is present on the left and the right side just to the lateral portion of the line.  She would like to think about a little bit longer I told her there is really no time out as long as she does not gain weight or have a baby just give me a call and we will set it up for her.  Would like to do is a very small lipo here in the office.

## 2022-10-16 ENCOUNTER — Ambulatory Visit: Payer: BC Managed Care – PPO | Admitting: Plastic Surgery

## 2022-10-16 DIAGNOSIS — F4010 Social phobia, unspecified: Secondary | ICD-10-CM | POA: Diagnosis not present

## 2022-10-16 DIAGNOSIS — F331 Major depressive disorder, recurrent, moderate: Secondary | ICD-10-CM | POA: Diagnosis not present

## 2022-10-16 DIAGNOSIS — F411 Generalized anxiety disorder: Secondary | ICD-10-CM | POA: Diagnosis not present

## 2022-10-17 DIAGNOSIS — R928 Other abnormal and inconclusive findings on diagnostic imaging of breast: Secondary | ICD-10-CM | POA: Diagnosis not present

## 2022-10-30 DIAGNOSIS — F411 Generalized anxiety disorder: Secondary | ICD-10-CM | POA: Diagnosis not present

## 2022-10-30 DIAGNOSIS — F4010 Social phobia, unspecified: Secondary | ICD-10-CM | POA: Diagnosis not present

## 2022-10-30 DIAGNOSIS — F331 Major depressive disorder, recurrent, moderate: Secondary | ICD-10-CM | POA: Diagnosis not present

## 2022-11-06 DIAGNOSIS — F4010 Social phobia, unspecified: Secondary | ICD-10-CM | POA: Diagnosis not present

## 2022-11-06 DIAGNOSIS — F331 Major depressive disorder, recurrent, moderate: Secondary | ICD-10-CM | POA: Diagnosis not present

## 2022-11-06 DIAGNOSIS — F411 Generalized anxiety disorder: Secondary | ICD-10-CM | POA: Diagnosis not present

## 2022-11-09 DIAGNOSIS — J0110 Acute frontal sinusitis, unspecified: Secondary | ICD-10-CM | POA: Diagnosis not present

## 2022-11-09 DIAGNOSIS — R059 Cough, unspecified: Secondary | ICD-10-CM | POA: Diagnosis not present

## 2022-11-14 DIAGNOSIS — Z86006 Personal history of melanoma in-situ: Secondary | ICD-10-CM | POA: Diagnosis not present

## 2022-11-14 DIAGNOSIS — Z08 Encounter for follow-up examination after completed treatment for malignant neoplasm: Secondary | ICD-10-CM | POA: Diagnosis not present

## 2022-11-14 DIAGNOSIS — D225 Melanocytic nevi of trunk: Secondary | ICD-10-CM | POA: Diagnosis not present

## 2022-11-14 DIAGNOSIS — Z1283 Encounter for screening for malignant neoplasm of skin: Secondary | ICD-10-CM | POA: Diagnosis not present

## 2022-12-13 ENCOUNTER — Encounter: Payer: Self-pay | Admitting: Plastic Surgery

## 2022-12-13 ENCOUNTER — Ambulatory Visit (INDEPENDENT_AMBULATORY_CARE_PROVIDER_SITE_OTHER): Payer: Self-pay | Admitting: Plastic Surgery

## 2022-12-13 ENCOUNTER — Telehealth: Payer: Self-pay | Admitting: *Deleted

## 2022-12-13 DIAGNOSIS — Z719 Counseling, unspecified: Secondary | ICD-10-CM

## 2022-12-13 NOTE — Telephone Encounter (Signed)
Spoke with patient about scheduling in office lipo appointment for 04/22/23 at 3p with arrival for 230p per [place]. Patient agreeable to date and time, accepted appointment. She did state she plans to send Dr. Marla Roe a MyChart message about vitamins.

## 2022-12-13 NOTE — Progress Notes (Signed)
   Subjective:    Patient ID: Patricia Shannon, female    DOB: Oct 15, 1975, 47 y.o.   MRN: [id number]  The patient is a 47 year old female here for evaluation of her abdomen.  She had a mini abdominoplasty last July.  She has some residual asymmetries that she would like to have dealt with.  She understands that there are limits to what we can do in the office but I am certainly willing to try to do some minimal liposuction to see if we can improve the area.      Review of Systems  Constitutional: Negative.   HENT: Negative.    Eyes: Negative.   Respiratory: Negative.    Cardiovascular: Negative.   Gastrointestinal: Negative.   Endocrine: Negative.   Genitourinary: Negative.        Objective:   Physical Exam Constitutional:      Appearance: Normal appearance.  Cardiovascular:     Rate and Rhythm: Normal rate.     Pulses: Normal pulses.  Pulmonary:     Effort: Pulmonary effort is normal.  Neurological:     Mental Status: She is alert and oriented to person, place, and time.  Psychiatric:        Mood and Affect: Mood normal.        Behavior: Behavior normal.        Thought Content: Thought content normal.        Judgment: Judgment normal.       Assessment & Plan:     ICD-10-CM   1. Encounter for counseling  Z71.9        Plan for liposuction in the office.

## 2023-02-07 DIAGNOSIS — B9689 Other specified bacterial agents as the cause of diseases classified elsewhere: Secondary | ICD-10-CM | POA: Diagnosis not present

## 2023-02-07 DIAGNOSIS — N760 Acute vaginitis: Secondary | ICD-10-CM | POA: Diagnosis not present

## 2023-02-07 DIAGNOSIS — F411 Generalized anxiety disorder: Secondary | ICD-10-CM | POA: Diagnosis not present

## 2023-02-07 DIAGNOSIS — R011 Cardiac murmur, unspecified: Secondary | ICD-10-CM | POA: Diagnosis not present

## 2023-02-13 DIAGNOSIS — Z85820 Personal history of malignant melanoma of skin: Secondary | ICD-10-CM | POA: Diagnosis not present

## 2023-02-13 DIAGNOSIS — D485 Neoplasm of uncertain behavior of skin: Secondary | ICD-10-CM | POA: Diagnosis not present

## 2023-02-13 DIAGNOSIS — D225 Melanocytic nevi of trunk: Secondary | ICD-10-CM | POA: Diagnosis not present

## 2023-02-13 DIAGNOSIS — L905 Scar conditions and fibrosis of skin: Secondary | ICD-10-CM | POA: Diagnosis not present

## 2023-02-13 DIAGNOSIS — Z08 Encounter for follow-up examination after completed treatment for malignant neoplasm: Secondary | ICD-10-CM | POA: Diagnosis not present

## 2023-02-13 DIAGNOSIS — Z1283 Encounter for screening for malignant neoplasm of skin: Secondary | ICD-10-CM | POA: Diagnosis not present

## 2023-02-14 DIAGNOSIS — R011 Cardiac murmur, unspecified: Secondary | ICD-10-CM | POA: Diagnosis not present

## 2023-03-04 ENCOUNTER — Encounter: Payer: Self-pay | Admitting: Family Medicine

## 2023-03-14 ENCOUNTER — Telehealth: Payer: Self-pay

## 2023-03-14 NOTE — Telephone Encounter (Signed)
Left message on My Chart with normal results per Dr. Krasowski's note. Routed to PCP. 

## 2023-04-03 DIAGNOSIS — G43119 Migraine with aura, intractable, without status migrainosus: Secondary | ICD-10-CM | POA: Diagnosis not present

## 2023-04-03 DIAGNOSIS — R110 Nausea: Secondary | ICD-10-CM | POA: Diagnosis not present

## 2023-04-11 DIAGNOSIS — Z1339 Encounter for screening examination for other mental health and behavioral disorders: Secondary | ICD-10-CM | POA: Diagnosis not present

## 2023-04-11 DIAGNOSIS — G43909 Migraine, unspecified, not intractable, without status migrainosus: Secondary | ICD-10-CM | POA: Diagnosis not present

## 2023-04-11 DIAGNOSIS — I10 Essential (primary) hypertension: Secondary | ICD-10-CM | POA: Diagnosis not present

## 2023-04-11 DIAGNOSIS — Z1331 Encounter for screening for depression: Secondary | ICD-10-CM | POA: Diagnosis not present

## 2023-04-22 ENCOUNTER — Encounter: Payer: BC Managed Care – PPO | Admitting: Plastic Surgery

## 2023-05-15 DIAGNOSIS — Z85820 Personal history of malignant melanoma of skin: Secondary | ICD-10-CM | POA: Diagnosis not present

## 2023-05-15 DIAGNOSIS — Z1283 Encounter for screening for malignant neoplasm of skin: Secondary | ICD-10-CM | POA: Diagnosis not present

## 2023-05-15 DIAGNOSIS — Z08 Encounter for follow-up examination after completed treatment for malignant neoplasm: Secondary | ICD-10-CM | POA: Diagnosis not present

## 2023-05-26 DIAGNOSIS — N760 Acute vaginitis: Secondary | ICD-10-CM | POA: Diagnosis not present

## 2023-06-26 DIAGNOSIS — R1110 Vomiting, unspecified: Secondary | ICD-10-CM | POA: Diagnosis not present

## 2023-06-26 DIAGNOSIS — G43119 Migraine with aura, intractable, without status migrainosus: Secondary | ICD-10-CM | POA: Diagnosis not present

## 2023-08-07 DIAGNOSIS — Z08 Encounter for follow-up examination after completed treatment for malignant neoplasm: Secondary | ICD-10-CM | POA: Diagnosis not present

## 2023-08-07 DIAGNOSIS — Z85820 Personal history of malignant melanoma of skin: Secondary | ICD-10-CM | POA: Diagnosis not present

## 2023-08-07 DIAGNOSIS — Z1283 Encounter for screening for malignant neoplasm of skin: Secondary | ICD-10-CM | POA: Diagnosis not present

## 2023-08-25 DIAGNOSIS — N760 Acute vaginitis: Secondary | ICD-10-CM | POA: Diagnosis not present

## 2023-08-25 DIAGNOSIS — N951 Menopausal and female climacteric states: Secondary | ICD-10-CM | POA: Diagnosis not present

## 2023-09-05 DIAGNOSIS — H5319 Other subjective visual disturbances: Secondary | ICD-10-CM | POA: Diagnosis not present

## 2023-09-15 DIAGNOSIS — J069 Acute upper respiratory infection, unspecified: Secondary | ICD-10-CM | POA: Diagnosis not present

## 2023-09-15 DIAGNOSIS — J029 Acute pharyngitis, unspecified: Secondary | ICD-10-CM | POA: Diagnosis not present

## 2023-09-15 DIAGNOSIS — F411 Generalized anxiety disorder: Secondary | ICD-10-CM | POA: Diagnosis not present

## 2023-10-20 DIAGNOSIS — Z08 Encounter for follow-up examination after completed treatment for malignant neoplasm: Secondary | ICD-10-CM | POA: Diagnosis not present

## 2023-10-20 DIAGNOSIS — Z1283 Encounter for screening for malignant neoplasm of skin: Secondary | ICD-10-CM | POA: Diagnosis not present

## 2023-10-20 DIAGNOSIS — Z85820 Personal history of malignant melanoma of skin: Secondary | ICD-10-CM | POA: Diagnosis not present

## 2023-10-20 DIAGNOSIS — C44519 Basal cell carcinoma of skin of other part of trunk: Secondary | ICD-10-CM | POA: Diagnosis not present

## 2023-10-21 DIAGNOSIS — Z1231 Encounter for screening mammogram for malignant neoplasm of breast: Secondary | ICD-10-CM | POA: Diagnosis not present

## 2023-11-16 DIAGNOSIS — G43119 Migraine with aura, intractable, without status migrainosus: Secondary | ICD-10-CM | POA: Diagnosis not present

## 2023-11-16 DIAGNOSIS — R0981 Nasal congestion: Secondary | ICD-10-CM | POA: Diagnosis not present

## 2023-12-01 DIAGNOSIS — D225 Melanocytic nevi of trunk: Secondary | ICD-10-CM | POA: Diagnosis not present

## 2023-12-01 DIAGNOSIS — Z85828 Personal history of other malignant neoplasm of skin: Secondary | ICD-10-CM | POA: Diagnosis not present

## 2023-12-01 DIAGNOSIS — Z08 Encounter for follow-up examination after completed treatment for malignant neoplasm: Secondary | ICD-10-CM | POA: Diagnosis not present

## 2023-12-01 DIAGNOSIS — Z85820 Personal history of malignant melanoma of skin: Secondary | ICD-10-CM | POA: Diagnosis not present

## 2023-12-19 DIAGNOSIS — H524 Presbyopia: Secondary | ICD-10-CM | POA: Diagnosis not present

## 2024-01-08 ENCOUNTER — Other Ambulatory Visit: Payer: Self-pay

## 2024-01-08 ENCOUNTER — Encounter (HOSPITAL_BASED_OUTPATIENT_CLINIC_OR_DEPARTMENT_OTHER): Payer: Self-pay

## 2024-01-08 ENCOUNTER — Emergency Department (HOSPITAL_BASED_OUTPATIENT_CLINIC_OR_DEPARTMENT_OTHER)
Admission: EM | Admit: 2024-01-08 | Discharge: 2024-01-09 | Disposition: A | Discharge status: Home / Self Care | Attending: Emergency Medicine | Admitting: Emergency Medicine

## 2024-01-08 DIAGNOSIS — R1310 Dysphagia, unspecified: Secondary | ICD-10-CM | POA: Insufficient documentation

## 2024-01-08 DIAGNOSIS — R221 Localized swelling, mass and lump, neck: Secondary | ICD-10-CM | POA: Insufficient documentation

## 2024-01-08 DIAGNOSIS — E041 Nontoxic single thyroid nodule: Secondary | ICD-10-CM | POA: Diagnosis not present

## 2024-01-08 DIAGNOSIS — E042 Nontoxic multinodular goiter: Secondary | ICD-10-CM | POA: Diagnosis not present

## 2024-01-08 DIAGNOSIS — Z7982 Long term (current) use of aspirin: Secondary | ICD-10-CM | POA: Diagnosis not present

## 2024-01-08 LAB — BASIC METABOLIC PANEL WITH GFR
Anion gap: 10 (ref 5–15)
BUN: 16 mg/dL (ref 6–20)
CO2: 22 mmol/L (ref 22–32)
Calcium: 9.2 mg/dL (ref 8.9–10.3)
Chloride: 105 mmol/L (ref 98–111)
Creatinine, Ser: 0.77 mg/dL (ref 0.44–1.00)
GFR, Estimated: >60 mL/min (ref >60)
Glucose, Bld: 110 mg/dL — ABNORMAL HIGH (ref 70–99)
Potassium: 3.5 mmol/L (ref 3.5–5.1)
Sodium: 137 mmol/L (ref 135–145)

## 2024-01-08 LAB — CBC WITH DIFFERENTIAL/PLATELET
Abs Immature Granulocytes: 0.03 10*3/uL (ref 0.00–0.07)
Basophils Absolute: 0.1 10*3/uL (ref 0.0–0.1)
Basophils Relative: 1 %
Eosinophils Absolute: 0.2 10*3/uL (ref 0.0–0.5)
Eosinophils Relative: 1 %
HCT: 36.1 % (ref 36.0–46.0)
Hemoglobin: 12.7 g/dL (ref 12.0–15.0)
Immature Granulocytes: 0 %
Lymphocytes Relative: 26 %
Lymphs Abs: 2.9 10*3/uL (ref 0.7–4.0)
MCH: 31.4 pg (ref 26.0–34.0)
MCHC: 35.2 g/dL (ref 30.0–36.0)
MCV: 89.4 fL (ref 80.0–100.0)
Monocytes Absolute: 1.1 10*3/uL — ABNORMAL HIGH (ref 0.1–1.0)
Monocytes Relative: 10 %
Neutro Abs: 6.8 10*3/uL (ref 1.7–7.7)
Neutrophils Relative %: 62 %
Platelets: 273 10*3/uL (ref 150–400)
RBC: 4.04 MIL/uL (ref 3.87–5.11)
RDW: 12.2 % (ref 11.5–15.5)
WBC: 11 10*3/uL — ABNORMAL HIGH (ref 4.0–10.5)
nRBC: 0 % (ref 0.0–0.2)

## 2024-01-08 NOTE — ED Provider Notes (Signed)
 [facility] Provider Note   CSN: [id number] Arrival date & time: 01/08/24  2014     History {Add pertinent medical, surgical, social history, OB history to HPI:1} Chief Complaint  Patient presents with   Mass    Patricia Shannon is a 48 y.o. female.  Patient with a history of subtotal thyroidectomy in 2012 here with swelling of her neck that she noticed this evening.  States had some discomfort to her left anterior neck that she noticed this afternoon and when she wiped her face this evening she felt the lump to her left anterior neck.  Some difficulty swallowing.  No difficulty breathing.  No fever.  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  States her thyroid numbers have been normal since and she has not had any issues.  No fever, chills, chest pain, shortness of breath but some difficulty swallowing.  No vomiting.  No drooling.  No rash.  The history is provided by the patient.       Home Medications Prior to Admission medications   Medication Sig Start Date End Date Taking? Authorizing Provider  ALPRAZolam Prudy Feeler) 0.5 MG tablet  08/06/16   [provider]  aspirin EC 81 MG tablet Take 81 mg by mouth daily.     [provider]  eletriptan (RELPAX) 40 MG tablet as needed. 05/20/17  Yes [provider]  LORazepam (ATIVAN) 1 MG tablet Take 1 tablet (1 mg total) by mouth at bedtime. 05/10/22   Hedges, Tinnie Gens, PA-C  metoprolol succinate (TOPROL-XL) 25 MG 24 hr tablet Take 12.5 mg by mouth daily.    [provider]  ondansetron (ZOFRAN ODT) 4 MG disintegrating tablet Take 1 tablet (4 mg total) by mouth every 8 (eight) hours as needed for nausea or vomiting. 05/19/21   Milagros Loll, MD  ondansetron (ZOFRAN) 4 MG tablet Take 1 tablet (4 mg total) by mouth every 8 (eight) hours as needed for nausea or vomiting. 03/28/22   Scheeler, Kermit Balo, PA-C  ondansetron (ZOFRAN) 8 MG  tablet Take 1 tablet (8 mg total) by mouth 3 (three) times daily as needed for nausea or vomiting (migraine). 03/15/20   Anson Fret, MD  OVER THE COUNTER MEDICATION Vitamin:Life Extension-Skin Restoring Ceramides-Take by mouth daily.    [provider]  promethazine (PHENERGAN) 25 MG tablet promethazine 25 mg tablet 06/07/16   [provider]  valACYclovir (VALTREX) 500 MG tablet  06/26/16  Yes [provider]      Allergies    Fentanyl, Amoxicillin, and Codeine    Review of Systems   Review of Systems  Constitutional:  Negative for activity change, appetite change and fever.  HENT:  Positive for trouble swallowing. Negative for congestion and rhinorrhea.   Respiratory:  Negative for cough, chest tightness and shortness of breath.   Cardiovascular:  Negative for chest pain.  Gastrointestinal:  Negative for abdominal pain, nausea and vomiting.  Genitourinary:  Negative for dysuria and hematuria.  Musculoskeletal:  Negative for arthralgias and myalgias.  Skin:  Negative for rash.  Neurological:  Negative for dizziness, facial asymmetry, weakness and headaches.   all other systems are negative except as noted in the HPI and PMH.    Physical Exam Updated Vital Signs BP (!) 169/88 (BP Location: Right Arm)   Pulse 74   Temp 98.4 F (36.9 C) (Oral)   Resp 17   Ht 5\' 9"  (1.753 m)  Wt 74.4 kg   SpO2 99%   BMI 24.22 kg/m  Physical Exam Vitals and nursing note reviewed.  Constitutional:      General: She is not in acute distress.    Appearance: She is well-developed.  HENT:     Head: Normocephalic and atraumatic.     Mouth/Throat:     Pharynx: No oropharyngeal exudate.     Comments: No drooling or stridor Eyes:     Conjunctiva/sclera: Conjunctivae normal.     Pupils: Pupils are equal, round, and reactive to light.  Neck:     Comments: No meningismus. Tender mass L anterior neck. No erythema or fluctuance.  Cardiovascular:     Rate and Rhythm:  Normal rate and regular rhythm.     Heart sounds: Normal heart sounds. No murmur heard. Pulmonary:     Effort: Pulmonary effort is normal. No respiratory distress.     Breath sounds: Normal breath sounds.  Abdominal:     Palpations: Abdomen is soft.     Tenderness: There is no abdominal tenderness. There is no guarding or rebound.  Musculoskeletal:        General: Normal range of motion.     Cervical back: Normal range of motion and neck supple. Tenderness present.  Skin:    General: Skin is warm.  Neurological:     Mental Status: She is alert and oriented to person, place, and time.     Cranial Nerves: No cranial nerve deficit.     Motor: No abnormal muscle tone.     Coordination: Coordination normal.     Comments:  5/5 strength throughout. CN 2-12 intact.Equal grip strength.   Psychiatric:        Behavior: Behavior normal.     ED Results / Procedures / Treatments   Labs (all labs ordered are listed, but only abnormal results are displayed) Labs Reviewed  CBC WITH DIFFERENTIAL/PLATELET - Abnormal; Notable for the following components:      Result Value   WBC 11.0 (*)    Monocytes Absolute 1.1 (*)    All other components within normal limits  BASIC METABOLIC PANEL WITH GFR - Abnormal; Notable for the following components:   Glucose, Bld 110 (*)    All other components within normal limits  TSH    EKG None  Radiology No results found.  Procedures Procedures  {Document cardiac monitor, telemetry assessment procedure when appropriate:1}  Medications Ordered in ED Medications - No data to display  ED Course/ Medical Decision Making/ A&P   {   Click here for ABCD2, HEART and other calculatorsREFRESH Note before signing :1}                              Medical Decision Making Amount and/or Complexity of Data Reviewed Labs: ordered. Decision-making details documented in ED Course. Radiology: ordered and independent interpretation performed. Decision-making details  documented in ED Course. ECG/medicine tests: ordered and independent interpretation performed. Decision-making details documented in ED Course.   L anterior neck swelling. No airway compromise. Controlling secretions.   Previous thyroidectomy but was benign per patient report.  Denies any issues since then.  {Document critical care time when appropriate:1} {Document review of labs and clinical decision tools ie heart score, Chads2Vasc2 etc:1}  {Document your independent review of radiology images, and any outside records:1} {Document your discussion with family members, caretakers, and with consultants:1} {Document social determinants of health affecting pt's care:1} {Document your decision  making why or why not admission, treatments were needed:1} Final Clinical Impression(s) / ED Diagnoses Final diagnoses:  None    Rx / DC Orders ED Discharge Orders     None

## 2024-01-08 NOTE — ED Triage Notes (Signed)
 Mass/ tenderness to left side of neck since this afternoon. Patient states discomfort while swallowing. Hx. Of right-sided thyroidectomy d/t tumor.

## 2024-01-09 ENCOUNTER — Emergency Department (HOSPITAL_BASED_OUTPATIENT_CLINIC_OR_DEPARTMENT_OTHER)

## 2024-01-09 DIAGNOSIS — E042 Nontoxic multinodular goiter: Secondary | ICD-10-CM | POA: Diagnosis not present

## 2024-01-09 LAB — TSH: TSH: 1.938 u[IU]/mL (ref 0.350–4.500)

## 2024-01-09 MED ORDER — IOHEXOL 300 MG/ML  SOLN
75.0000 mL | Freq: Once | INTRAMUSCULAR | Status: AC | PRN
  Administered 2024-01-09: 75 mL via INTRAVENOUS

## 2024-01-09 NOTE — Discharge Instructions (Signed)
 CT scan shows thyroid nodule on the left side.  There are several nodules with the largest one is 2.1 cm.  The neck step is to obtain an ultrasound for further evaluation.  Follow-up with your primary doctor for this as well as the ear nose and throat doctor for consideration of biopsy.  It is important that you have further workup of this to ensure there is nothing cancerous.  Return to the ED with difficulty breathing, difficulty swallowing, chest pain or other concerns.

## 2024-01-12 DIAGNOSIS — E041 Nontoxic single thyroid nodule: Secondary | ICD-10-CM | POA: Diagnosis not present

## 2024-01-15 DIAGNOSIS — E042 Nontoxic multinodular goiter: Secondary | ICD-10-CM | POA: Diagnosis not present

## 2024-01-15 DIAGNOSIS — E041 Nontoxic single thyroid nodule: Secondary | ICD-10-CM | POA: Diagnosis not present

## 2024-02-09 DIAGNOSIS — Z08 Encounter for follow-up examination after completed treatment for malignant neoplasm: Secondary | ICD-10-CM | POA: Diagnosis not present

## 2024-02-09 DIAGNOSIS — Z85820 Personal history of malignant melanoma of skin: Secondary | ICD-10-CM | POA: Diagnosis not present

## 2024-02-09 DIAGNOSIS — Z1283 Encounter for screening for malignant neoplasm of skin: Secondary | ICD-10-CM | POA: Diagnosis not present

## 2024-02-09 DIAGNOSIS — D225 Melanocytic nevi of trunk: Secondary | ICD-10-CM | POA: Diagnosis not present

## 2024-03-12 DIAGNOSIS — M25552 Pain in left hip: Secondary | ICD-10-CM | POA: Diagnosis not present

## 2024-03-12 DIAGNOSIS — G43009 Migraine without aura, not intractable, without status migrainosus: Secondary | ICD-10-CM | POA: Diagnosis not present

## 2024-03-12 DIAGNOSIS — E042 Nontoxic multinodular goiter: Secondary | ICD-10-CM | POA: Diagnosis not present

## 2024-05-03 DIAGNOSIS — L821 Other seborrheic keratosis: Secondary | ICD-10-CM | POA: Diagnosis not present

## 2024-05-03 DIAGNOSIS — L84 Corns and callosities: Secondary | ICD-10-CM | POA: Diagnosis not present

## 2024-05-15 DIAGNOSIS — R110 Nausea: Secondary | ICD-10-CM | POA: Diagnosis not present

## 2024-05-15 DIAGNOSIS — G43119 Migraine with aura, intractable, without status migrainosus: Secondary | ICD-10-CM | POA: Diagnosis not present

## 2024-05-25 DIAGNOSIS — R0789 Other chest pain: Secondary | ICD-10-CM | POA: Diagnosis not present

## 2024-05-25 DIAGNOSIS — Z136 Encounter for screening for cardiovascular disorders: Secondary | ICD-10-CM | POA: Diagnosis not present

## 2024-05-25 DIAGNOSIS — Z1331 Encounter for screening for depression: Secondary | ICD-10-CM | POA: Diagnosis not present

## 2024-05-25 DIAGNOSIS — I10 Essential (primary) hypertension: Secondary | ICD-10-CM | POA: Diagnosis not present

## 2024-06-04 DIAGNOSIS — R102 Pelvic and perineal pain: Secondary | ICD-10-CM | POA: Diagnosis not present

## 2024-06-15 DIAGNOSIS — Z136 Encounter for screening for cardiovascular disorders: Secondary | ICD-10-CM | POA: Diagnosis not present

## 2024-07-11 DIAGNOSIS — G43119 Migraine with aura, intractable, without status migrainosus: Secondary | ICD-10-CM | POA: Diagnosis not present

## 2024-09-17 DIAGNOSIS — R519 Headache, unspecified: Secondary | ICD-10-CM | POA: Diagnosis not present
